# Patient Record
Sex: Female | Born: 1965 | Hispanic: Yes | Marital: Married | Smoking: Never smoker
Health system: Southern US, Community
[De-identification: ages and names within clinical notes are randomized; demographics above are authoritative.]

## PROBLEM LIST (undated history)

## (undated) DIAGNOSIS — I10 Essential (primary) hypertension: Secondary | ICD-10-CM

## (undated) DIAGNOSIS — A150 Tuberculosis of lung: Secondary | ICD-10-CM

## (undated) HISTORY — PX: CHOLECYSTECTOMY, LAPAROSCOPIC: SHX56

## (undated) HISTORY — PX: OTHER SURGICAL HISTORY: SHX169

## (undated) HISTORY — PX: BRAIN BIOPSY: SHX905

## (undated) HISTORY — PX: TUBAL LIGATION: SHX77

## (undated) HISTORY — DX: Tuberculosis of lung: A15.0

---

## 1987-06-02 DIAGNOSIS — A150 Tuberculosis of lung: Secondary | ICD-10-CM

## 1987-06-02 HISTORY — DX: Tuberculosis of lung: A15.0

## 1987-06-02 HISTORY — PX: BRAIN BIOPSY: SHX905

## 2016-01-27 DIAGNOSIS — Z8611 Personal history of tuberculosis: Secondary | ICD-10-CM | POA: Insufficient documentation

## 2017-10-04 ENCOUNTER — Encounter (HOSPITAL_BASED_OUTPATIENT_CLINIC_OR_DEPARTMENT_OTHER): Payer: Self-pay | Admitting: Emergency Medicine

## 2017-10-04 ENCOUNTER — Observation Stay (HOSPITAL_BASED_OUTPATIENT_CLINIC_OR_DEPARTMENT_OTHER)
Admission: EM | Admit: 2017-10-04 | Discharge: 2017-10-05 | Disposition: A | Discharge status: Home / Self Care | Payer: 59 | Attending: Internal Medicine | Admitting: Internal Medicine

## 2017-10-04 ENCOUNTER — Other Ambulatory Visit: Payer: Self-pay

## 2017-10-04 ENCOUNTER — Emergency Department (HOSPITAL_BASED_OUTPATIENT_CLINIC_OR_DEPARTMENT_OTHER): Payer: 59

## 2017-10-04 DIAGNOSIS — E876 Hypokalemia: Secondary | ICD-10-CM | POA: Diagnosis present

## 2017-10-04 DIAGNOSIS — G459 Transient cerebral ischemic attack, unspecified: Secondary | ICD-10-CM | POA: Diagnosis not present

## 2017-10-04 DIAGNOSIS — R079 Chest pain, unspecified: Secondary | ICD-10-CM | POA: Diagnosis not present

## 2017-10-04 DIAGNOSIS — R2990 Unspecified symptoms and signs involving the nervous system: Secondary | ICD-10-CM

## 2017-10-04 DIAGNOSIS — Z3202 Encounter for pregnancy test, result negative: Secondary | ICD-10-CM | POA: Insufficient documentation

## 2017-10-04 DIAGNOSIS — R200 Anesthesia of skin: Secondary | ICD-10-CM

## 2017-10-04 DIAGNOSIS — I10 Essential (primary) hypertension: Secondary | ICD-10-CM | POA: Diagnosis not present

## 2017-10-04 DIAGNOSIS — R531 Weakness: Secondary | ICD-10-CM | POA: Diagnosis present

## 2017-10-04 HISTORY — DX: Essential (primary) hypertension: I10

## 2017-10-04 LAB — CBC
HEMATOCRIT: 43.5 % (ref 36.0–46.0)
HEMOGLOBIN: 14.6 g/dL (ref 12.0–15.0)
MCH: 28.5 pg (ref 26.0–34.0)
MCHC: 33.6 g/dL (ref 30.0–36.0)
MCV: 85 fL (ref 78.0–100.0)
PLATELETS: 354 10*3/uL (ref 150–400)
RBC: 5.12 MIL/uL — AB (ref 3.87–5.11)
RDW: 14.7 % (ref 11.5–15.5)
WBC: 8.3 10*3/uL (ref 4.0–10.5)

## 2017-10-04 LAB — URINALYSIS, MICROSCOPIC (REFLEX)

## 2017-10-04 LAB — DIFFERENTIAL
Basophils Absolute: 0 10*3/uL (ref 0.0–0.1)
Basophils Relative: 1 %
EOS PCT: 2 %
Eosinophils Absolute: 0.1 10*3/uL (ref 0.0–0.7)
LYMPHS PCT: 36 %
Lymphs Abs: 3 10*3/uL (ref 0.7–4.0)
Monocytes Absolute: 0.6 10*3/uL (ref 0.1–1.0)
Monocytes Relative: 7 %
Neutro Abs: 4.6 10*3/uL (ref 1.7–7.7)
Neutrophils Relative %: 54 %

## 2017-10-04 LAB — URINALYSIS, ROUTINE W REFLEX MICROSCOPIC
Bilirubin Urine: NEGATIVE
GLUCOSE, UA: NEGATIVE mg/dL
KETONES UR: NEGATIVE mg/dL
Leukocytes, UA: NEGATIVE
Nitrite: NEGATIVE
PROTEIN: NEGATIVE mg/dL
Specific Gravity, Urine: 1.01 (ref 1.005–1.030)
pH: 7 (ref 5.0–8.0)

## 2017-10-04 LAB — ETHANOL: Alcohol, Ethyl (B): <10 mg/dL (ref <10)

## 2017-10-04 LAB — COMPREHENSIVE METABOLIC PANEL
ALBUMIN: 4.4 g/dL (ref 3.5–5.0)
ALK PHOS: 81 U/L (ref 38–126)
ALT: 19 U/L (ref 14–54)
ANION GAP: 10 (ref 5–15)
AST: 31 U/L (ref 15–41)
BUN: 8 mg/dL (ref 6–20)
CO2: 25 mmol/L (ref 22–32)
Calcium: 9 mg/dL (ref 8.9–10.3)
Chloride: 103 mmol/L (ref 101–111)
Creatinine, Ser: 0.66 mg/dL (ref 0.44–1.00)
GFR calc Af Amer: >60 mL/min (ref >60)
GFR calc non Af Amer: >60 mL/min (ref >60)
GLUCOSE: 94 mg/dL (ref 65–99)
POTASSIUM: 2.9 mmol/L — AB (ref 3.5–5.1)
SODIUM: 138 mmol/L (ref 135–145)
Total Bilirubin: 0.5 mg/dL (ref 0.3–1.2)
Total Protein: 7.8 g/dL (ref 6.5–8.1)

## 2017-10-04 LAB — PREGNANCY, URINE: Preg Test, Ur: NEGATIVE

## 2017-10-04 LAB — TROPONIN I: Troponin I: <0.03 ng/mL (ref <0.03)

## 2017-10-04 LAB — CBG MONITORING, ED: GLUCOSE-CAPILLARY: 88 mg/dL (ref 65–99)

## 2017-10-04 LAB — RAPID URINE DRUG SCREEN, HOSP PERFORMED
AMPHETAMINES: NOT DETECTED
BARBITURATES: NOT DETECTED
BENZODIAZEPINES: NOT DETECTED
COCAINE: NOT DETECTED
Opiates: NOT DETECTED
TETRAHYDROCANNABINOL: NOT DETECTED

## 2017-10-04 LAB — PROTIME-INR
INR: 0.96
PROTHROMBIN TIME: 12.6 s (ref 11.4–15.2)

## 2017-10-04 LAB — APTT: aPTT: 31 seconds (ref 24–36)

## 2017-10-04 MED ORDER — POTASSIUM CHLORIDE CRYS ER 20 MEQ PO TBCR
40.0000 meq | EXTENDED_RELEASE_TABLET | Freq: Once | ORAL | Status: AC
  Administered 2017-10-04: 40 meq via ORAL
  Filled 2017-10-04: qty 2

## 2017-10-04 MED ORDER — POTASSIUM CHLORIDE 10 MEQ/100ML IV SOLN
10.0000 meq | Freq: Once | INTRAVENOUS | Status: AC
  Administered 2017-10-04: 10 meq via INTRAVENOUS
  Filled 2017-10-04: qty 100

## 2017-10-04 MED ORDER — MAGNESIUM SULFATE 2 GM/50ML IV SOLN
2.0000 g | Freq: Once | INTRAVENOUS | Status: AC
  Administered 2017-10-04: 2 g via INTRAVENOUS
  Filled 2017-10-04: qty 50

## 2017-10-04 NOTE — ED Notes (Signed)
Pt. Has no changes in any of her neuro assessments   The Pt. Is a little slower on the L with her movements she has been this way for 30 years she states due to a surgical procedure she had on her brain.

## 2017-10-04 NOTE — ED Triage Notes (Signed)
Pt presents with c/o chest pain that started at 10 am this morning. Pt also reports intermittent numbness in left arm. No arm drift. When patient smiles left side of face slightly drawn. Pt reports left side of face aches and feels tight and left arm feels tight

## 2017-10-04 NOTE — Progress Notes (Signed)
Patient arrived to unit via stretcher with [facility] from [employer]. Patient is alert and oriented. Independent with mobility. Welcomed to [facility] [employer]. Will continue to monitor. Lawson Radar

## 2017-10-04 NOTE — ED Notes (Signed)
Pt. Given orange juice due to no breakfast and feeling like her blood sugar is dropping.

## 2017-10-04 NOTE — ED Provider Notes (Signed)
Emergency Department Provider Note   I have reviewed the triage vital signs and the nursing notes.   HISTORY  Chief Complaint Chest Pain and Extremity Weakness   HPI Holly Daugherty is a 52 y.o. female with a history of hypertension but no other medical problems aside from a distant history of neurologic tuberculosis who presents to the emergency department today secondary to left-sided "weird feeling" and "numbness".  States around 10:00 she had the acute onset of symptoms of left arm weakness and feeling strange.  She said it kind of felt cold and numb but no paresthesias.  She could not quite put her finger what it was.  She started having some mild chest pain around the same time. Also has an abnormal smile that is new. No recent illnesses. No sob. No fevers, cough, diaphoresis or nausea. No other associated or modifying symptoms.    Past Medical History:  Diagnosis Date  . Hypertension     There are no active problems to display for this patient.   History reviewed. No pertinent surgical history.  Current Outpatient Rx  . Order #: [id number] Class: Historical Med  . Order #: [id number] Class: Historical Med    Allergies Patient has no known allergies.  No family history on file.  Social History Social History   Tobacco Use  . Smoking status: Never Smoker  . Smokeless tobacco: Never Used  Substance Use Topics  . Alcohol use: Never    Frequency: Never  . Drug use: Never    Review of Systems  All other systems negative except as documented in the HPI. All pertinent positives and negatives as reviewed in the HPI. ____________________________________________   PHYSICAL EXAM:  VITAL SIGNS: ED Triage Vitals  Enc Vitals Group     BP 10/04/17 1306 (!) 167/85     Pulse Rate 10/04/17 1306 80     Resp 10/04/17 1306 20     Temp 10/04/17 1306 98.7 F (37.1 C)     Temp Source 10/04/17 1306 Oral     SpO2 10/04/17 1306 100 %     Weight 10/04/17 1300 157 lb (71.2 kg)      Height 10/04/17 1300  (1.626 m)    Constitutional: Alert and oriented. Well appearing and in no acute distress. Eyes: Conjunctivae are normal. PERRL. EOMI. Head: Atraumatic. Nose: No congestion/rhinnorhea. Mouth/Throat: Mucous membranes are moist.  Oropharynx non-erythematous. Neck: No stridor.  No meningeal signs.   Cardiovascular: Normal rate, regular rhythm. Good peripheral circulation. Grossly normal heart sounds.   Respiratory: Normal respiratory effort.  No retractions. Lungs CTAB. Gastrointestinal: Soft and nontender. No distention.  Musculoskeletal: No lower extremity tenderness nor edema. No gross deformities of extremities. Neurologic:  Normal speech and language. No gross focal neurologic deficits are appreciated. Abnormal sensation in LUE, left facial droop when smiling.  Skin:  Skin is warm, dry and intact. No rash noted.  ____________________________________________   LABS (all labs ordered are listed, but only abnormal results are displayed)  Labs Reviewed  CBC - Abnormal; Notable for the following components:      Result Value   RBC 5.12 (*)    All other components within normal limits  COMPREHENSIVE METABOLIC PANEL - Abnormal; Notable for the following components:   Potassium 2.9 (*)    All other components within normal limits  URINALYSIS, ROUTINE W REFLEX MICROSCOPIC - Abnormal; Notable for the following components:   Hgb urine dipstick SMALL (*)    All other components within normal limits  URINALYSIS,  MICROSCOPIC (REFLEX) - Abnormal; Notable for the following components:   Bacteria, UA RARE (*)    All other components within normal limits  ETHANOL  PROTIME-INR  APTT  DIFFERENTIAL  TROPONIN I  RAPID URINE DRUG SCREEN, HOSP PERFORMED  PREGNANCY, URINE   ____________________________________________  EKG   EKG Interpretation  Date/Time:  Monday Oct 04 2017 13:00:50 EDT Ventricular Rate:  77 PR Interval:  144 QRS Duration: 82 QT  Interval:  384 QTC Calculation: 434 R Axis:   72 Text Interpretation:  Normal sinus rhythm Normal ECG No old tracing to compare Confirmed by Marily Memos [phone]) on 10/04/2017 1:54:02 PM       ____________________________________________  RADIOLOGY  Dg Chest 2 View  Result Date: 10/04/2017 CLINICAL DATA:  Heavy feeling in chest since 10 a.m. EXAM: CHEST - 2 VIEW COMPARISON:  None. FINDINGS: Normal heart size. Lungs clear. No pneumothorax. No pleural effusion. Mild scoliosis. IMPRESSION: No active cardiopulmonary disease. Electronically Signed   By: Jolaine Click M.D.   On: 10/04/2017 14:23   Ct Head Code Stroke Wo Contrast  Result Date: 10/04/2017 CLINICAL DATA:  Code stroke. Chest pain. LEFT arm and leg numbness and weakness, EXAM: CT HEAD WITHOUT CONTRAST TECHNIQUE: Contiguous axial images were obtained from the base of the skull through the vertex without intravenous contrast. COMPARISON:  None. FINDINGS: Brain: No acute stroke, hemorrhage, mass lesion, hydrocephalus, or extra-axial fluid. Mild atrophy. There is tubular encephalomalacia extending from the RIGHT frontal cortex through the white matter along the frontal horn of the ventricle, resulting in an ovoid area of brain substance loss affecting the RIGHT basal ganglia and external capsule. Previous surgical procedure, RIGHT frontal burr hole, likely some previously implanted device with resultant RIGHT lentiform nucleus chronic infarction. Vascular: No hyperdense vessel or unexpected calcification. Skull: Other than RIGHT frontal burr hole, negative. Sinuses/Orbits: No acute finding. Other: None. ASPECTS [facility] Stroke Program Early CT Score) - Ganglionic level infarction (caudate, lentiform nuclei, internal capsule, insula, M1-M3 cortex): 7 - Supraganglionic infarction (M4-M6 cortex): 3 Total score (0-10 with 10 being normal): 10 IMPRESSION: 1. No acute stroke is evident. Some type of surgical procedure, likely previously implanted device  now removed, has resulted in encephalomalacia and RIGHT basal ganglia infarct. 2. ASPECTS is 10. These results were called by telephone at the time of interpretation on 10/04/2017 at 1:48 pm to Dr. Marily Memos , who verbally acknowledged these results. Electronically Signed   By: Elsie Stain M.D.   On: 10/04/2017 13:49    ____________________________________________   PROCEDURES  Procedure(s) performed:   Procedures  CRITICAL CARE Performed by: Marily Memos Total critical care time: 35 minutes Critical care time was exclusive of separately billable procedures and treating other patients. Critical care was necessary to treat or prevent imminent or life-threatening deterioration. Critical care was time spent personally by me on the following activities: development of treatment plan with patient and/or surrogate as well as nursing, discussions with consultants, evaluation of patient's response to treatment, examination of patient, obtaining history from patient or surrogate, ordering and performing treatments and interventions, ordering and review of laboratory studies, ordering and review of radiographic studies, pulse oximetry and re-evaluation of patient's condition.  ____________________________________________   INITIAL IMPRESSION / ASSESSMENT AND PLAN / ED COURSE  Code stroke called as patient is only had approximately 3 and half hours of symptoms with what was supposedly a new left sided facial droop and then abnormal feeling in her left arm and leg.  CT scan showed that she  had a previous surgical procedure likely sister with a history of neurologic tuberculosis.  Tele-neurology is seen and recommends work-up for stroke versus TIA.  Chest discomfort seems to be associated with the left arm and left leg symptoms so she needs an ACS rule out as well.     Pertinent labs & imaging results that were available during my care of the patient were reviewed by me and considered in my  medical decision making (see chart for details).  ____________________________________________  FINAL CLINICAL IMPRESSION(S) / ED DIAGNOSES  Final diagnoses:  Nonspecific chest pain  Stroke-like symptoms     MEDICATIONS GIVEN DURING THIS VISIT:  Medications - No data to display   NEW OUTPATIENT MEDICATIONS STARTED DURING THIS VISIT:  New Prescriptions   No medications on file    Note:  This note was prepared with assistance of Dragon voice recognition software. Occasional wrong-word or sound-a-like substitutions may have occurred due to the inherent limitations of voice recognition software.   Marily Memos, MD 10/04/17 [phone]

## 2017-10-04 NOTE — Consult Note (Signed)
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  Unclear if the left-sided numbness is recrudescence from this old basal ganglia/subinsular area of damage versus seizure versus a new TIA or stroke.  Given the sudden onset of her symptoms however and the involvement of the left arm and leg would be cautious and treat of the stroke until proven otherwise.  She already took to low-dose aspirin prior to coming into the hospital with certainly give a full 325 dose and admitted for stroke workup/neurology consultation.  Not a tpa candidate due to: Minor nondisabling symptoms  Symptoms (not) consistent with LVO therefore no role for NIR  Differential Diagnosis: As above 1. Cardioembolic stroke 2. Small vessel disease/lacune 3. Thromboembolic, artery-to-artery mechanism 4. Hypercoagulable state-related infarct 5. Transient ischemic attack 6. Thrombotic mechanism, large artery disease  Comments Last known normal 10:00 Door time: 12:51 [facility] contacted: 13:31 [facility] at bedside: 13:39 NIHSS assessment time: 13:40  Recommendations: -Start aspirin -Admit for stroke workup as above -Inpatient neurology consultation  Inpatient neurology consultation Inpatient stroke evaluation as per Neurology/ Internal Medicine Discussed with ED MD Please call with questions ----------------------------------------------------------------------------------------- CC left-sided numbness  History of Present Illness  Patient is a pleasant 52 year old woman with 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  She has no other vascular risk factors but today at 10 AM noticed the sudden onset of left upper extremity numbness primarily below the elbow and in all 5 fingers but also a tight sensation in the left upper extremity.   Also some slight chest pain.  She takes no blood thinners and has no history of stroke.  She never had any recurrence of the CNS tuberculosis.  She denies having any residual weakness numbness or tingling from the brain biopsy.  She has no history of seizure disorder.  Diagnostic: CT head without contrast shows chronic encephalomalacia in the right basal ganglia as well as right frontal region with evidence of a bone defect in the right frontal region.  Nothing acute.  Exam: 1a- LOC: Keenly responsive - =0     1b- LOC questions: Answers both questions correctly - 0     1c- LOC commands- Performs both tasks correctly- 0     2- Gaze: Normal; no gaze paresis or gaze deviation - 0     3- Visual Fields: normal, no Visual field deficit - 0     4- Facial movements: no facial palsy - 0     5- Upper limb motor - no drift -0     6- Lower limb motor - no drift - 0      7- Limb Coordination: absent ataxia - 0      8- Sensory : Decreased sensation to the left arm and leg face is same=1 9- Language - No aphasia - 0      10- Speech - No dysarthria -0     11- Neglect / Extinction - none found -0     NIHSS score  1   Medical Decision Making: - Extensive number of diagnosis or management options are considered above. - Extensive amount of complex data reviewed. - High risk of complication and/or morbidity or mortality are associated with differential diagnostic considerations above. - There may be Uncertain outcome and increased probability of prolonged functional impairment or high probability of severe prolonged functional impairment associated with some of  these differential diagnosis. Medical Data Reviewed: 1.Data reviewed include clinical labs, radiology, Medical Tests; 2.Tests results discussed w/performing or interpreting physician; 3.Obtaining/reviewing old medical records; 4.Obtaining case history from another source; 5.Independent review of image, tracing or specimen. Patient was informed  the Neurology Consult would happen via telehealth (remote video) and consented to receiving care in this manner.

## 2017-10-04 NOTE — ED Notes (Signed)
Informed EDP of pt. Potassium being 2.9

## 2017-10-04 NOTE — Progress Notes (Signed)
Patient presenting to [facility] for chest pain, left-sided weakness, left facial droop.  She was called Code Stroke.  Symptoms started at 10AM.  CT with old encephalomalacia from a biopsy from neurologic TB remotely.  Also has a h/o HTN.  Neurologist recommends MRI and stroke work up.  Will accept to tele Obs.  Georgana Curio, M.D.

## 2017-10-04 NOTE — ED Notes (Signed)
Tele Stroke in room and Dr. Clayborne Dana in room.  Pt. Clearly assessed for Code stroke.  Pt. Reports she last felt normal at 10:00am and symptoms began at 10:00 am.  Pt. York Spaniel that is when she felt her Left arm numb and weak and cold.  She reports her entire jaw felt weak.  Pt. Does have L side droop slightly with smile.  Pt. Has clear speech and clear recollection of entire morning. Pt. Able to feel touch to side of body equally during EDP assessment and does report she took 2 baby asprin at 11am.

## 2017-10-05 ENCOUNTER — Observation Stay (HOSPITAL_BASED_OUTPATIENT_CLINIC_OR_DEPARTMENT_OTHER): Payer: 59

## 2017-10-05 ENCOUNTER — Observation Stay (HOSPITAL_COMMUNITY): Payer: 59

## 2017-10-05 ENCOUNTER — Encounter (HOSPITAL_COMMUNITY): Payer: Self-pay | Admitting: Internal Medicine

## 2017-10-05 DIAGNOSIS — G459 Transient cerebral ischemic attack, unspecified: Secondary | ICD-10-CM

## 2017-10-05 DIAGNOSIS — R079 Chest pain, unspecified: Secondary | ICD-10-CM

## 2017-10-05 DIAGNOSIS — E876 Hypokalemia: Secondary | ICD-10-CM | POA: Diagnosis not present

## 2017-10-05 DIAGNOSIS — R200 Anesthesia of skin: Secondary | ICD-10-CM

## 2017-10-05 DIAGNOSIS — I10 Essential (primary) hypertension: Secondary | ICD-10-CM | POA: Diagnosis present

## 2017-10-05 LAB — COMPREHENSIVE METABOLIC PANEL
ALT: 18 U/L (ref 14–54)
AST: 25 U/L (ref 15–41)
Albumin: 3.7 g/dL (ref 3.5–5.0)
Alkaline Phosphatase: 58 U/L (ref 38–126)
Anion gap: 9 (ref 5–15)
BUN: 6 mg/dL (ref 6–20)
CO2: 23 mmol/L (ref 22–32)
Calcium: 8.9 mg/dL (ref 8.9–10.3)
Chloride: 108 mmol/L (ref 101–111)
Creatinine, Ser: 0.76 mg/dL (ref 0.44–1.00)
GFR calc Af Amer: >60 mL/min (ref >60)
GFR calc non Af Amer: >60 mL/min (ref >60)
Glucose, Bld: 91 mg/dL (ref 65–99)
Potassium: 3.6 mmol/L (ref 3.5–5.1)
Sodium: 140 mmol/L (ref 135–145)
Total Bilirubin: 1 mg/dL (ref 0.3–1.2)
Total Protein: 6.7 g/dL (ref 6.5–8.1)

## 2017-10-05 LAB — TROPONIN I
Troponin I: <0.03 ng/mL (ref <0.03)
Troponin I: <0.03 ng/mL (ref <0.03)
Troponin I: <0.03 ng/mL (ref <0.03)

## 2017-10-05 LAB — CBC
HEMATOCRIT: 41.1 % (ref 36.0–46.0)
HEMOGLOBIN: 13.2 g/dL (ref 12.0–15.0)
MCH: 27.8 pg (ref 26.0–34.0)
MCHC: 32.1 g/dL (ref 30.0–36.0)
MCV: 86.7 fL (ref 78.0–100.0)
Platelets: 305 10*3/uL (ref 150–400)
RBC: 4.74 MIL/uL (ref 3.87–5.11)
RDW: 14.7 % (ref 11.5–15.5)
WBC: 7.2 10*3/uL (ref 4.0–10.5)

## 2017-10-05 LAB — HIV ANTIBODY (ROUTINE TESTING W REFLEX): HIV Screen 4th Generation wRfx: NONREACTIVE

## 2017-10-05 LAB — LIPID PANEL
CHOLESTEROL: 168 mg/dL (ref 0–200)
HDL: 58 mg/dL (ref >40)
LDL Cholesterol: 90 mg/dL (ref 0–99)
TRIGLYCERIDES: 99 mg/dL (ref <150)
Total CHOL/HDL Ratio: 2.9 RATIO
VLDL: 20 mg/dL (ref 0–40)

## 2017-10-05 LAB — MAGNESIUM: Magnesium: 2.5 mg/dL — ABNORMAL HIGH (ref 1.7–2.4)

## 2017-10-05 LAB — ECHOCARDIOGRAM COMPLETE
Height: 64 in
Weight: 2512 oz

## 2017-10-05 LAB — HEMOGLOBIN A1C
HEMOGLOBIN A1C: 5.4 % (ref 4.8–5.6)
MEAN PLASMA GLUCOSE: 108.28 mg/dL

## 2017-10-05 LAB — D-DIMER, QUANTITATIVE: D-Dimer, Quant: <0.27 ug/mL-FEU (ref 0.00–0.50)

## 2017-10-05 MED ORDER — ACETAMINOPHEN 325 MG PO TABS: 650.0000 mg | ORAL_TABLET | ORAL | Status: DC | PRN

## 2017-10-05 MED ORDER — ACETAMINOPHEN 160 MG/5ML PO SOLN: 650.0000 mg | ORAL | Status: DC | PRN

## 2017-10-05 MED ORDER — ASPIRIN 300 MG RE SUPP: 300.0000 mg | Freq: Every day | RECTAL | Status: DC

## 2017-10-05 MED ORDER — IOPAMIDOL (ISOVUE-370) INJECTION 76%
50.0000 mL | Freq: Once | INTRAVENOUS | Status: AC
  Administered 2017-10-05: 50 mL via INTRAVENOUS

## 2017-10-05 MED ORDER — STROKE: EARLY STAGES OF RECOVERY BOOK
Freq: Once | Status: AC
  Administered 2017-10-05: 05:00:00

## 2017-10-05 MED ORDER — ASPIRIN 325 MG PO TABS
325.0000 mg | ORAL_TABLET | Freq: Every day | ORAL | Status: DC
  Administered 2017-10-05: 325 mg via ORAL
  Filled 2017-10-05: qty 1

## 2017-10-05 MED ORDER — ENOXAPARIN SODIUM 40 MG/0.4ML ~~LOC~~ SOLN
40.0000 mg | SUBCUTANEOUS | Status: DC
  Filled 2017-10-05: qty 0.4

## 2017-10-05 MED ORDER — ACETAMINOPHEN 650 MG RE SUPP: 650.0000 mg | RECTAL | Status: DC | PRN

## 2017-10-05 MED ORDER — LISINOPRIL 5 MG PO TABS
5.0000 mg | ORAL_TABLET | Freq: Every day | ORAL | Status: DC
  Administered 2017-10-05: 5 mg via ORAL
  Filled 2017-10-05: qty 1

## 2017-10-05 NOTE — Progress Notes (Signed)
OT Cancellation Note  Patient Details Name: Holly Daugherty MRN: [id number] DOB: 12/12/1965   Cancelled Treatment:    Reason Eval/Treat Not Completed: OT screened, no needs identified, will sign off  [facility], OT/L  [phone] 10/05/2017 10/05/2017, 1:20 PM

## 2017-10-05 NOTE — Progress Notes (Signed)
Pt being discharged from hospital per orders from MD. Pt educated on discharge instructions. Pt verbalized understanding of instructions. All questions and concerns were addressed. Pt's IV was removed prior to discharge. Pt exited hospital via ambulation accompanied by staff. 

## 2017-10-05 NOTE — Evaluation (Signed)
Physical Therapy Evaluation Patient Details Name: Holly Daugherty MRN: [id number] DOB: 05-14-1966 Today's Date: 10/05/2017   History of Present Illness  Caterin Tabares is a 52 y.o. 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 Neuro work up underway.  Clinical Impression  Patient seen for mobility assessment after presenting with neurological changes. Mobilizing well. No significant impairments noted at this time. No further acute PT needs. Will sign off.  Follow Up Recommendations No PT follow up    Equipment Recommendations  None recommended by PT    Recommendations for Other Services       Precautions / Restrictions        Mobility  Bed Mobility Overal bed mobility: Independent                Transfers Overall transfer level: Independent                  Ambulation/Gait Ambulation/Gait assistance: Independent Ambulation Distance (Feet): 350 Feet Assistive device: None Gait Pattern/deviations: WFL(Within Functional Limits)   Gait velocity interpretation: >4.37 ft/sec, indicative of normal walking speed General Gait Details: normal gait, steady with no overt LOB noted  Stairs Stairs: Yes Stairs assistance: Modified independent (Device/Increase time) Stair Management: One rail Right Number of Stairs: 6 General stair comments: no difficulties  Wheelchair Mobility    Modified Rankin (Stroke Patients Only) Modified Rankin (Stroke Patients Only) Pre-Morbid Rankin Score: No symptoms Modified Rankin: No symptoms     Balance Overall balance assessment: Independent   Sitting balance-Leahy Scale: Normal       Standing balance-Leahy Scale: Normal               High level balance activites: Side stepping;Backward walking;Direction changes;Turns;Head turns;Sudden stops High Level Balance  Comments: steady with all higher level tasks             Pertinent Vitals/Pain Pain Assessment: No/denies pain    Home Living Family/patient expects to be discharged to:: Private residence Living Arrangements: Spouse/significant other Available Help at Discharge: Family Type of Home: House Home Access: Stairs to enter Entrance Stairs-Rails: None Entrance Stairs-Number of Steps: 2 Home Layout: Two level;Able to live on main level with bedroom/bathroom Home Equipment: None      Prior Function Level of Independence: Independent               Hand Dominance   Dominant Hand: Right    Extremity/Trunk Assessment   Upper Extremity Assessment Upper Extremity Assessment: Defer to OT evaluation    Lower Extremity Assessment Lower Extremity Assessment: Overall WFL for tasks assessed       Communication   Communication: No difficulties  Cognition Arousal/Alertness: Awake/alert Behavior During Therapy: WFL for tasks assessed/performed Overall Cognitive Status: Within Functional Limits for tasks assessed                                        General Comments      Exercises     Assessment/Plan    PT Assessment Patent does not need any further PT services  PT Problem List         PT Treatment Interventions      PT Goals (Current goals can be found in the Care Plan section)  Acute Rehab  PT Goals PT Goal Formulation: All assessment and education complete, DC therapy    Frequency     Barriers to discharge        Co-evaluation               AM-PAC PT "6 Clicks" Daily Activity  Outcome Measure Difficulty turning over in bed (including adjusting bedclothes, sheets and blankets)?: None Difficulty moving from lying on back to sitting on the side of the bed? : None Difficulty sitting down on and standing up from a chair with arms (e.g., wheelchair, bedside commode, etc,.)?: None Help needed moving to and from a bed to chair (including  a wheelchair)?: None Help needed walking in hospital room?: None Help needed climbing 3-5 steps with a railing? : A Little 6 Click Score: 23    End of Session Equipment Utilized During Treatment: Gait belt Activity Tolerance: Patient tolerated treatment well Patient left: in chair;with call bell/phone within reach Nurse Communication: Mobility status PT Visit Diagnosis: Other symptoms and signs involving the nervous system (R29.898)    Time: 0865-7846 PT Time Calculation (min) (ACUTE ONLY): 15 min   Charges:   PT Evaluation $PT Eval Low Complexity: 1 Low     PT G Codes:        Charlotte Crumb, PT DPT  Board Certified Neurologic Specialist [phone]   Fabio Asa 10/05/2017, 8:32 AM

## 2017-10-05 NOTE — Discharge Summary (Signed)
Physician Discharge Summary  Holly Daugherty WUJ:[id number] DOB: July 29, 1965 DOA: 10/04/2017  PCP: No primary care provider on file.  Admit date: 10/04/2017 Discharge date: 10/05/2017  Admitted From: home Disposition:  home  Recommendations for Outpatient Follow-up:  1. Follow up with PCP in 3-4 weeks. She has an appointment next month for an annual visit.   Home Health: none Equipment/Devices: none  Discharge Condition: stable CODE STATUS: Full code Diet recommendation: regular  HPI: Per Dr. Gilford Silvius, Holly Daugherty is a 52 y.o. 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  Since symptoms continued patient came to the ER at [facility].  Denies any weakness of the upper or lower extremity or any difficulty speaking or swallowing or visual symptoms. ED Course: CT head was showing chronic changes.  On-call tele neurologist was consulted.  Requested further stroke work-up.  While in the ER patient also developed some substernal chest pressure lasted for few minutes and resolved without any intervention.  Hospital Course: TIA - patient was admitted to the hospital with left upper and lower extremity numbness, now resolved. Neurology consulted and followed patient while hospitalized. She underwent an MRI of the brain which was without acute findings except for her chronic encephalomalacia in the right basal ganglia and frontal lobe consistent with patient's history of remote tuberculosis and biopsy. Lipid panel showed an LDL of 90. A1C is 5.4. CT angio head and neck without large vessel occlusion or significant stenosis. She underwent a 2D echo with normal EF and no WMA Chest pain - atypical, resolved. Troponin negative x 2, normal 2D echo, EKG without ischemic changes. D  dimer negative.  Lung nodules - incidental finding, small, recommending outpatient follow up with a repeat non contrast CT chest in 3-6 months. This was discussed with the patient, she already has a follow up appointment with her PCP in June 2019 for annual visit. History of CNS TB - post treatment HTN - continue home medications   Discharge Diagnoses:  Principal Problem:   TIA (transient ischemic attack) Active Problems:   Nonspecific chest pain   Essential hypertension   Hypokalemia   Left sided numbness  Discharge Instructions  Allergies as of 10/05/2017   No Known Allergies     Medication List    TAKE these medications   aspirin 81 MG chewable tablet Chew 162 mg by mouth as needed.   Biotin 5000 MCG Caps Take 1 capsule by mouth daily.   C-500/ROSE HIPS PO Take 1 capsule by mouth daily.   lisinopril 5 MG tablet Commonly known as:  PRINIVIL,ZESTRIL Take 5 mg by mouth daily.      Follow-up Information    PCP. Schedule an appointment as soon as possible for a visit in 5 week(s).   Why:  as scheduled for annual.          Consultations:  Neurology   Procedures/Studies:  2D echo  Study Conclusions - Left ventricle: The cavity size was normal. Wall thickness was normal. Systolic function was normal. The estimated ejection fraction was in the range of 55% to 60%. Wall motion was normal; there were no regional wall motion abnormalities. Left ventricular diastolic function parameters were normal. - Mitral valve: There was mild regurgitation.   EEG Impression: This awake and asleep EEG  is normal.    Ct Angio Head W Or Wo Contrast  Result Date: 10/05/2017 CLINICAL DATA:  Sudden onset left upper extremity numbness. Remote history of CNS tuberculosis and brain biopsy. EXAM: CT ANGIOGRAPHY HEAD AND NECK TECHNIQUE: Multidetector CT imaging of the head and neck was performed using the standard protocol during bolus administration of intravenous contrast. Multiplanar  CT image reconstructions and MIPs were obtained to evaluate the vascular anatomy. Carotid stenosis measurements (when applicable) are obtained utilizing NASCET criteria, using the distal internal carotid diameter as the denominator. CONTRAST:  50mL ISOVUE-370 IOPAMIDOL (ISOVUE-370) INJECTION 76% COMPARISON:  Head CT 10/04/2017 FINDINGS: CTA NECK FINDINGS Aortic arch: Standard 3 vessel aortic arch. Widely patent brachiocephalic and subclavian arteries. Right carotid system: Patent without evidence of stenosis, dissection, or significant atherosclerosis. Left carotid system: Patent without evidence of stenosis, dissection, or significant atherosclerosis. Vertebral arteries: Patent without evidence of stenosis or significant atherosclerosis. Mild irregularity of the left V4 segment including mild segmental dilatation distally. Dominant right vertebral artery. Skeleton: No fracture or suspicious osseous lesion. Other neck: No mass or enlarged lymph nodes. Upper chest: Mild biapical pleural-parenchymal scarring. More nodular density in the posterior left lung apex extending to the pleura measures 10 x 6 mm. Slightly spiculated nodule in the right lung apex measures 6 x 5 mm. Additional smaller nodules are present in both apices. Review of the MIP images confirms the above findings CTA HEAD FINDINGS Anterior circulation: The internal carotid arteries are widely patent from skull base to carotid termini. The ACAs and MCAs are patent without evidence of proximal branch occlusion or significant proximal stenosis. No aneurysm. Posterior circulation: The intracranial vertebral arteries are patent scratched at the intracranial right vertebral artery is widely patent and strongly dominant. The left vertebral artery is particularly hypoplastic distal to the PICA origin. Patent SCA origins are identified bilaterally. The basilar artery is widely patent. Both PCAs are patent without evidence of significant stenosis. There is a  fetal origin of the right PCA. No aneurysm. Venous sinuses: Patent. Anatomic variants: Fetal origin of the right PCA. Delayed phase: No abnormal enhancement. Review of the MIP images confirms the above findings IMPRESSION: 1. No large vessel occlusion or significant stenosis of the intracranial or cervical arterial vasculature. 2. Mild irregularity and slight dilatation of the distal left V4 segment, possibly reflecting an old, healed dissection or fibromuscular dysplasia. 3. Biapical lung nodules measuring up to 8 mm. Non-contrast chest CT at 3-6 months is recommended. If the nodules are stable at time of repeat CT, then future CT at 18-24 months (from today's scan) is considered optional for low-risk patients, but is recommended for high-risk patients. This recommendation follows the consensus statement: Guidelines for Management of Incidental Pulmonary Nodules Detected on CT Images: From the Fleischner Society 2017; Radiology 2017; 284:228-243. Electronically Signed   By: Sebastian Ache M.D.   On: 10/05/2017 12:22   Dg Chest 2 View  Result Date: 10/04/2017 CLINICAL DATA:  Heavy feeling in chest since 10 a.m. EXAM: CHEST - 2 VIEW COMPARISON:  None. FINDINGS: Normal heart size. Lungs clear. No pneumothorax. No pleural effusion. Mild scoliosis. IMPRESSION: No active cardiopulmonary disease. Electronically Signed   By: Jolaine Click M.D.   On: 10/04/2017 14:23   Ct Angio Neck W Or Wo Contrast  Result Date: 10/05/2017 CLINICAL DATA:  Sudden onset left upper extremity numbness. Remote history of CNS tuberculosis and brain biopsy. EXAM: CT ANGIOGRAPHY HEAD AND NECK TECHNIQUE: Multidetector CT imaging of the head and neck was performed  using the standard protocol during bolus administration of intravenous contrast. Multiplanar CT image reconstructions and MIPs were obtained to evaluate the vascular anatomy. Carotid stenosis measurements (when applicable) are obtained utilizing NASCET criteria, using the distal  internal carotid diameter as the denominator. CONTRAST:  50mL ISOVUE-370 IOPAMIDOL (ISOVUE-370) INJECTION 76% COMPARISON:  Head CT 10/04/2017 FINDINGS: CTA NECK FINDINGS Aortic arch: Standard 3 vessel aortic arch. Widely patent brachiocephalic and subclavian arteries. Right carotid system: Patent without evidence of stenosis, dissection, or significant atherosclerosis. Left carotid system: Patent without evidence of stenosis, dissection, or significant atherosclerosis. Vertebral arteries: Patent without evidence of stenosis or significant atherosclerosis. Mild irregularity of the left V4 segment including mild segmental dilatation distally. Dominant right vertebral artery. Skeleton: No fracture or suspicious osseous lesion. Other neck: No mass or enlarged lymph nodes. Upper chest: Mild biapical pleural-parenchymal scarring. More nodular density in the posterior left lung apex extending to the pleura measures 10 x 6 mm. Slightly spiculated nodule in the right lung apex measures 6 x 5 mm. Additional smaller nodules are present in both apices. Review of the MIP images confirms the above findings CTA HEAD FINDINGS Anterior circulation: The internal carotid arteries are widely patent from skull base to carotid termini. The ACAs and MCAs are patent without evidence of proximal branch occlusion or significant proximal stenosis. No aneurysm. Posterior circulation: The intracranial vertebral arteries are patent scratched at the intracranial right vertebral artery is widely patent and strongly dominant. The left vertebral artery is particularly hypoplastic distal to the PICA origin. Patent SCA origins are identified bilaterally. The basilar artery is widely patent. Both PCAs are patent without evidence of significant stenosis. There is a fetal origin of the right PCA. No aneurysm. Venous sinuses: Patent. Anatomic variants: Fetal origin of the right PCA. Delayed phase: No abnormal enhancement. Review of the MIP images confirms  the above findings IMPRESSION: 1. No large vessel occlusion or significant stenosis of the intracranial or cervical arterial vasculature. 2. Mild irregularity and slight dilatation of the distal left V4 segment, possibly reflecting an old, healed dissection or fibromuscular dysplasia. 3. Biapical lung nodules measuring up to 8 mm. Non-contrast chest CT at 3-6 months is recommended. If the nodules are stable at time of repeat CT, then future CT at 18-24 months (from today's scan) is considered optional for low-risk patients, but is recommended for high-risk patients. This recommendation follows the consensus statement: Guidelines for Management of Incidental Pulmonary Nodules Detected on CT Images: From the Fleischner Society 2017; Radiology 2017; 284:228-243. Electronically Signed   By: Sebastian Ache M.D.   On: 10/05/2017 12:22   Mr Brain Wo Contrast  Result Date: 10/05/2017 CLINICAL DATA:  Sudden onset left upper and lower extremity numbness. Remote history of CNS tuberculosis and brain biopsy. EXAM: MRI HEAD WITHOUT CONTRAST TECHNIQUE: Multiplanar, multiecho pulse sequences of the brain and surrounding structures were obtained without intravenous contrast. COMPARISON:  Head CT 10/04/2017 FINDINGS: Brain: No acute infarct, mass, midline shift, or extra-axial fluid collection is seen. There is encephalomalacia involving the right basal ganglia extending into the corona radiata with associated chronic blood products. There is also encephalomalacia more anteriorly and superiorly in the right frontal lobe extending to a burr hole consistent with the patient's history of remote biopsy. There is slight ex vacuo enlargement of the body of the right lateral ventricle. The ventricles are otherwise normal in size. A few scattered punctate foci of T2 hyperintensity in the cerebral white matter bilaterally are nonspecific. Vascular: Major intracranial vascular flow voids are  preserved. Skull and upper cervical spine:  Unremarkable bone marrow signal. Sinuses/Orbits: Unremarkable orbits. Mild mucosal thickening in the paranasal sinuses. Clear mastoid air cells. Other: None. IMPRESSION: 1. No acute intracranial abnormality. 2. Encephalomalacia in the right basal ganglia and frontal lobe consistent with patient's history of remote tuberculosis and biopsy. Electronically Signed   By: Sebastian Ache M.D.   On: 10/05/2017 12:31   Ct Head Code Stroke Wo Contrast  Result Date: 10/04/2017 CLINICAL DATA:  Code stroke. Chest pain. LEFT arm and leg numbness and weakness, EXAM: CT HEAD WITHOUT CONTRAST TECHNIQUE: Contiguous axial images were obtained from the base of the skull through the vertex without intravenous contrast. COMPARISON:  None. FINDINGS: Brain: No acute stroke, hemorrhage, mass lesion, hydrocephalus, or extra-axial fluid. Mild atrophy. There is tubular encephalomalacia extending from the RIGHT frontal cortex through the white matter along the frontal horn of the ventricle, resulting in an ovoid area of brain substance loss affecting the RIGHT basal ganglia and external capsule. Previous surgical procedure, RIGHT frontal burr hole, likely some previously implanted device with resultant RIGHT lentiform nucleus chronic infarction. Vascular: No hyperdense vessel or unexpected calcification. Skull: Other than RIGHT frontal burr hole, negative. Sinuses/Orbits: No acute finding. Other: None. ASPECTS [facility] Stroke Program Early CT Score) - Ganglionic level infarction (caudate, lentiform nuclei, internal capsule, insula, M1-M3 cortex): 7 - Supraganglionic infarction (M4-M6 cortex): 3 Total score (0-10 with 10 being normal): 10 IMPRESSION: 1. No acute stroke is evident. Some type of surgical procedure, likely previously implanted device now removed, has resulted in encephalomalacia and RIGHT basal ganglia infarct. 2. ASPECTS is 10. These results were called by telephone at the time of interpretation on 10/04/2017 at 1:48 pm to Dr.  Marily Memos , who verbally acknowledged these results. Electronically Signed   By: Elsie Stain M.D.   On: 10/04/2017 13:49      Subjective: - no chest pain, shortness of breath, no abdominal pain, nausea or vomiting.   Discharge Exam: Vitals:   10/05/17 0811 10/05/17 1238  BP: 134/85 122/84  Pulse: 87 89  Resp: 16 16  Temp:  98.1 F (36.7 C)  SpO2: 97% 99%    General: Pt is alert, awake, not in acute distress Cardiovascular: RRR, S1/S2 +, no rubs, no gallops Respiratory: CTA bilaterally, no wheezing, no rhonchi Abdominal: Soft, NT, ND, bowel sounds + Extremities: no edema, no cyanosis    The results of significant diagnostics from this hospitalization (including imaging, microbiology, ancillary and laboratory) are listed below for reference.     Microbiology: No results found for this or any previous visit (from the past 240 hour(s)).   Labs: BNP (last 3 results) No results for input(s): BNP in the last 8760 hours. Basic Metabolic Panel: Recent Labs  Lab 10/04/17 1325 10/05/17 0511  NA 138 140  K 2.9* 3.6  CL 103 108  CO2 25 23  GLUCOSE 94 91  BUN 8 6  CREATININE 0.66 0.76  CALCIUM 9.0 8.9  MG  --  2.5*   Liver Function Tests: Recent Labs  Lab 10/04/17 1325 10/05/17 0511  AST 31 25  ALT 19 18  ALKPHOS 81 58  BILITOT 0.5 1.0  PROT 7.8 6.7  ALBUMIN 4.4 3.7   No results for input(s): LIPASE, AMYLASE in the last 168 hours. No results for input(s): AMMONIA in the last 168 hours. CBC: Recent Labs  Lab 10/04/17 1325 10/05/17 0511  WBC 8.3 7.2  NEUTROABS 4.6  --   HGB 14.6 13.2  HCT  43.5 41.1  MCV 85.0 86.7  PLT 354 305   Cardiac Enzymes: Recent Labs  Lab 10/04/17 1320 10/05/17 0511 10/05/17 0933  TROPONINI <0.03 <0.03 <0.03   BNP: Invalid input(s): POCBNP CBG: Recent Labs  Lab 10/04/17 1539  GLUCAP 88   D-Dimer Recent Labs    10/05/17 0728  DDIMER <0.27   Hgb A1c Recent Labs    10/05/17 0511  HGBA1C 5.4   Lipid  Profile Recent Labs    10/05/17 0511  CHOL 168  HDL 58  LDLCALC 90  TRIG 99  CHOLHDL 2.9   Thyroid function studies No results for input(s): TSH, T4TOTAL, T3FREE, THYROIDAB in the last 72 hours.  Invalid input(s): FREET3 Anemia work up No results for input(s): VITAMINB12, FOLATE, FERRITIN, TIBC, IRON, RETICCTPCT in the last 72 hours. Urinalysis    Component Value Date/Time   COLORURINE YELLOW 10/04/2017 1325   APPEARANCEUR CLEAR 10/04/2017 1325   LABSPEC 1.010 10/04/2017 1325   PHURINE 7.0 10/04/2017 1325   GLUCOSEU NEGATIVE 10/04/2017 1325   HGBUR SMALL (A) 10/04/2017 1325   BILIRUBINUR NEGATIVE 10/04/2017 1325   KETONESUR NEGATIVE 10/04/2017 1325   PROTEINUR NEGATIVE 10/04/2017 1325   NITRITE NEGATIVE 10/04/2017 1325   LEUKOCYTESUR NEGATIVE 10/04/2017 1325   Sepsis Labs Invalid input(s): PROCALCITONIN,  WBC,  LACTICIDVEN   Time coordinating discharge: 25 minutes  SIGNED:  Pamella Pert, MD  [facility] 10/05/2017, 4:24 PM Pager [phone]  If 7PM-7AM, please contact night-coverage [email or web address] Password [email or web address]

## 2017-10-05 NOTE — Discharge Instructions (Signed)
Follow with PCP as scheduled next month for your annual visit.   On CT scan we found lung nodules measuring up to 8 mm. Non-contrast chest CT at 3-6 months is recommended. Please discuss this with your PCP.  Please get a complete blood count and chemistry panel checked by your Primary MD at your next visit, and again as instructed by your Primary MD. Please get your medications reviewed and adjusted by your Primary MD.  Please request your Primary MD to go over all Hospital Tests and Procedure/Radiological results at the follow up, please get all Hospital records sent to your Prim MD by signing hospital release before you go home.  If you had Pneumonia of Lung problems at the Hospital: Please get a 2 view Chest X ray done in 6-8 weeks after hospital discharge or sooner if instructed by your Primary MD.  If you have Congestive Heart Failure: Please call your Cardiologist or Primary MD anytime you have any of the following symptoms:  1) 3 pound weight gain in 24 hours or 5 pounds in 1 week  2) shortness of breath, with or without a dry hacking cough  3) swelling in the hands, feet or stomach  4) if you have to sleep on extra pillows at night in order to breathe  Follow cardiac low salt diet and 1.5 lit/day fluid restriction.  If you have diabetes Accuchecks 4 times/day, Once in AM empty stomach and then before each meal. Log in all results and show them to your primary doctor at your next visit. If any glucose reading is under 80 or above 300 call your primary MD immediately.  If you have Seizure/Convulsions/Epilepsy: Please do not drive, operate heavy machinery, participate in activities at heights or participate in high speed sports until you have seen by Primary MD or a Neurologist and advised to do so again.  If you had Gastrointestinal Bleeding: Please ask your Primary MD to check a complete blood count within one week of discharge or at your next visit. Your endoscopic/colonoscopic  biopsies that are pending at the time of discharge, will also need to followed by your Primary MD.  Get Medicines reviewed and adjusted. Please take all your medications with you for your next visit with your Primary MD  Please request your Primary MD to go over all hospital tests and procedure/radiological results at the follow up, please ask your Primary MD to get all Hospital records sent to his/her office.  If you experience worsening of your admission symptoms, develop shortness of breath, life threatening emergency, suicidal or homicidal thoughts you must seek medical attention immediately by calling 911 or calling your MD immediately  if symptoms less severe.  You must read complete instructions/literature along with all the possible adverse reactions/side effects for all the Medicines you take and that have been prescribed to you. Take any new Medicines after you have completely understood and accpet all the possible adverse reactions/side effects.   Do not drive or operate heavy machinery when taking Pain medications.   Do not take more than prescribed Pain, Sleep and Anxiety Medications  Special Instructions: If you have smoked or chewed Tobacco  in the last 2 yrs please stop smoking, stop any regular Alcohol  and or any Recreational drug use.  Wear Seat belts while driving.  Please note You were cared for by a hospitalist during your hospital stay. If you have any questions about your discharge medications or the care you received while you were in the  hospital after you are discharged, you can call the unit and asked to speak with the hospitalist on call if the hospitalist that took care of you is not available. Once you are discharged, your primary care physician will handle any further medical issues. Please note that NO REFILLS for any discharge medications will be authorized once you are discharged, as it is imperative that you return to your primary care physician (or establish a  relationship with a primary care physician if you do not have one) for your aftercare needs so that they can reassess your need for medications and monitor your lab values.  You can reach the hospitalist office at phone [phone] or fax [phone]   If you do not have a primary care physician, you can call [phone] for a physician referral.  Activity: As tolerated with Full fall precautions use walker/cane & assistance as needed  Diet: regular  Disposition Home

## 2017-10-05 NOTE — Procedures (Signed)
ELECTROENCEPHALOGRAM REPORT  Date of Study: 10/05/2017  Patient's Name: [place] Nolton MRN: [id number] Date of Birth: 11/15/1965  Referring Provider: Dr. Midge Minium  Clinical History: This is a 52 year old woman with left-sided numbness  Medications: No AEDs or sedating medications listed  Technical Summary: A multichannel digital EEG recording measured by the international 10-20 system with electrodes applied with paste and impedances below 5000 ohms performed in our laboratory with EKG monitoring in an awake and asleep patient.  Hyperventilation and photic stimulation were performed.  The digital EEG was referentially recorded, reformatted, and digitally filtered in a variety of bipolar and referential montages for optimal display.    Description: The patient is awake and asleep during the recording.  During maximal wakefulness, there is a symmetric, medium voltage 10 Hz posterior dominant rhythm that attenuates with eye opening.  The record is symmetric.  During drowsiness and sleep, there is an increase in theta slowing of the background.  Vertex waves and symmetric sleep spindles were seen.  Hyperventilation and photic stimulation did not elicit any abnormalities.  There were no epileptiform discharges or electrographic seizures seen.    EKG lead was unremarkable.  Impression: This awake and asleep EEG is normal.    Clinical Correlation: A normal EEG does not exclude a clinical diagnosis of epilepsy. Clinical correlation is advised.   Patrcia Dolly, M.D.

## 2017-10-05 NOTE — Progress Notes (Signed)
Went to Pt's room for EEG - pt is at MRI/CT

## 2017-10-05 NOTE — Progress Notes (Signed)
EEG complete - results pending 

## 2017-10-05 NOTE — Progress Notes (Signed)
SLP Cancellation Note  Patient Details Name: Emunah Texidor MRN: [id number] DOB: Oct 21, 1965   Cancelled treatment:       Reason Eval/Treat Not Completed: SLP screened, no needs identified, will sign off   Glenette Bookwalter 10/05/2017, 11:44 AM

## 2017-10-05 NOTE — Consult Note (Signed)
Stroke Neurology Consultation Note  Consult Requested by: Dr. Elvera Lennox  Reason for Consult: left arm numbness  Consult Date: 10/05/17  The history was obtained from the pt.  During history and examination, all items was able to obtain unless otherwise noted.  History of Present Illness:  Holly Daugherty is a 52 y.o. 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  She stated that she was at work and she started to feel left forearm from elbow to left hand numbness and cold feeling, no weakness.  This feeling on and off, lasting more than an hour each episode, and total of about 12 hours.  In the meantime she had a left jaw tense sensation, lasted also about 8 to 10 hours.  She denies any headache, vision changes, speech difficulty, shaking, jerking, LOC or weakness.  She went to urgent care and was sent to ER for evaluation.  Overnight her symptoms resolved.  CT and MRI showed no acute abnormality except right basal ganglia and biopsy tract encephalomalacia.  CTA head and neck showed nondominant left VA V4 stenosis, otherwise vascular unremarkable but bilateral apical lung nodules which may need to follow-up with PCP as outpatient.  CXR negative.  UDS negative, LDL 90, A1c 5.4, HIV negative.  She had CNS TB 30 years ago started with headache during pregnancy, she had induced labor, however recurrent headache, CT showed right brain mass.  Status post biopsy x2, diagnosed with CNS TB and was treated for 2 years.  She had very mild residual left hand dexterity difficulty.  She also stated that she had a intermittent left skin at deltoid region tension sensation, as well as intermittent left cramping in the tensing sensation, but those were chronic.  She also had a history of migraine many years ago since brain biopsy.  Migraine headache was typical migraine episode with headache, had to  go to dark room lay down, put ice in the head.  Due to her previous CNS infection history, frequently she got MRI to rule out recurrent infection.  At that time she had a once or twice a month of migraine headache but last headache was many years ago.  She does have sometimes very mild headache episode since the biopsy, however it was rare.   Past Medical History:  Diagnosis Date  . Hypertension     Past Surgical History:  Procedure Laterality Date  . BRAIN BIOPSY      Family History  Problem Relation Age of Onset  . Diabetes Mellitus II Mother   . CAD Father     Social History:  reports that she has never smoked. She has never used smokeless tobacco. She reports that she drinks alcohol. She reports that she does not use drugs.  Allergies: No Known Allergies  No current facility-administered medications on file prior to encounter.    Current Outpatient Medications on File Prior to Encounter  Medication Sig Dispense Refill  . Ascorbic Acid (C-500/ROSE HIPS PO) Take 1 capsule by mouth daily.    Marland Kitchen aspirin 81 MG chewable tablet Chew 162 mg by mouth as needed.    . Biotin 5000 MCG CAPS Take 1 capsule by mouth daily.    Marland Kitchen lisinopril (PRINIVIL,ZESTRIL) 5 MG tablet Take 5 mg by mouth daily.      Review of Systems: A full ROS was attempted today and was able to be performed.  Systems  assessed include - Constitutional, Eyes,  HENT, Respiratory, Cardiovascular, Gastrointestinal, Genitourinary, Integument/breast, Hematologic/lymphatic, Musculoskeletal, Neurological, Behavioral/Psych, Endocrine, Allergic/Immunologic - with pertinent responses as per HPI.  Physical Examination: Temp:  [97.5 F (36.4 C)-98.1 F (36.7 C)] 98.1 F (36.7 C) (05/07 1238) Pulse Rate:  [68-90] 89 (05/07 1238) Resp:  [14-23] 16 (05/07 1238) BP: (93-148)/(58-93) 122/84 (05/07 1238) SpO2:  [97 %-100 %] 99 % (05/07 1238)  General - well nourished, well developed, in no apparent distress.    Ophthalmologic -  fundi not visualized due to noncooperation.    Cardiovascular - regular rate and rhythm  Mental Status -  Level of arousal and orientation to time, place, and person were intact. Language including expression, naming, repetition, comprehension, reading, and writing was assessed and found intact. Attention span and concentration were normal. Fund of Knowledge was assessed and was intact.  Cranial Nerves II - XII - II - Vision intact OU. III, IV, VI - Extraocular movements intact. V - Facial sensation intact bilaterally. VII - Facial movement intact bilaterally. VIII - Hearing & vestibular intact bilaterally. X - Palate elevates symmetrically. XI - Chin turning & shoulder shrug intact bilaterally. XII - Tongue protrusion intact.  Motor Strength - The patient's strength was normal in all extremities except left hand mild dexterity difficulty and left foot DF 4/5 and pronator drift was absent.   Motor Tone & Bulk - Muscle tone was assessed at the neck and appendages and was normal.  Bulk was normal and fasciculations were absent.   Reflexes - The patient's reflexes were normal in all extremities and she had no pathological reflexes.  Sensory - Light touch, temperature/pinprick were assessed and were normal.    Coordination - The patient had normal movements in the hands and feet with no ataxia or dysmetria.  Tremor was absent.  Gait and Station - deferred  Data Reviewed: I have personally reviewed the radiological images below and agree with the radiology interpretations.  Ct Angio Head W Or Wo Contrast  Result Date: 10/05/2017 CLINICAL DATA:  Sudden onset left upper extremity numbness. Remote history of CNS tuberculosis and brain biopsy. EXAM: CT ANGIOGRAPHY HEAD AND NECK TECHNIQUE: Multidetector CT imaging of the head and neck was performed using the standard protocol during bolus administration of intravenous contrast. Multiplanar CT image reconstructions and MIPs were obtained to  evaluate the vascular anatomy. Carotid stenosis measurements (when applicable) are obtained utilizing NASCET criteria, using the distal internal carotid diameter as the denominator. CONTRAST:  50mL ISOVUE-370 IOPAMIDOL (ISOVUE-370) INJECTION 76% COMPARISON:  Head CT 10/04/2017 FINDINGS: CTA NECK FINDINGS Aortic arch: Standard 3 vessel aortic arch. Widely patent brachiocephalic and subclavian arteries. Right carotid system: Patent without evidence of stenosis, dissection, or significant atherosclerosis. Left carotid system: Patent without evidence of stenosis, dissection, or significant atherosclerosis. Vertebral arteries: Patent without evidence of stenosis or significant atherosclerosis. Mild irregularity of the left V4 segment including mild segmental dilatation distally. Dominant right vertebral artery. Skeleton: No fracture or suspicious osseous lesion. Other neck: No mass or enlarged lymph nodes. Upper chest: Mild biapical pleural-parenchymal scarring. More nodular density in the posterior left lung apex extending to the pleura measures 10 x 6 mm. Slightly spiculated nodule in the right lung apex measures 6 x 5 mm. Additional smaller nodules are present in both apices. Review of the MIP images confirms the above findings CTA HEAD FINDINGS Anterior circulation: The internal carotid arteries are widely patent from skull base to carotid termini. The ACAs and MCAs are patent without evidence of  proximal branch occlusion or significant proximal stenosis. No aneurysm. Posterior circulation: The intracranial vertebral arteries are patent scratched at the intracranial right vertebral artery is widely patent and strongly dominant. The left vertebral artery is particularly hypoplastic distal to the PICA origin. Patent SCA origins are identified bilaterally. The basilar artery is widely patent. Both PCAs are patent without evidence of significant stenosis. There is a fetal origin of the right PCA. No aneurysm. Venous  sinuses: Patent. Anatomic variants: Fetal origin of the right PCA. Delayed phase: No abnormal enhancement. Review of the MIP images confirms the above findings IMPRESSION: 1. No large vessel occlusion or significant stenosis of the intracranial or cervical arterial vasculature. 2. Mild irregularity and slight dilatation of the distal left V4 segment, possibly reflecting an old, healed dissection or fibromuscular dysplasia. 3. Biapical lung nodules measuring up to 8 mm. Non-contrast chest CT at 3-6 months is recommended. If the nodules are stable at time of repeat CT, then future CT at 18-24 months (from today's scan) is considered optional for low-risk patients, but is recommended for high-risk patients. This recommendation follows the consensus statement: Guidelines for Management of Incidental Pulmonary Nodules Detected on CT Images: From the Fleischner Society 2017; Radiology 2017; 284:228-243. Electronically Signed   By: Sebastian Ache M.D.   On: 10/05/2017 12:22   Dg Chest 2 View  Result Date: 10/04/2017 CLINICAL DATA:  Heavy feeling in chest since 10 a.m. EXAM: CHEST - 2 VIEW COMPARISON:  None. FINDINGS: Normal heart size. Lungs clear. No pneumothorax. No pleural effusion. Mild scoliosis. IMPRESSION: No active cardiopulmonary disease. Electronically Signed   By: Jolaine Click M.D.   On: 10/04/2017 14:23   Ct Angio Neck W Or Wo Contrast  Result Date: 10/05/2017 CLINICAL DATA:  Sudden onset left upper extremity numbness. Remote history of CNS tuberculosis and brain biopsy. EXAM: CT ANGIOGRAPHY HEAD AND NECK TECHNIQUE: Multidetector CT imaging of the head and neck was performed using the standard protocol during bolus administration of intravenous contrast. Multiplanar CT image reconstructions and MIPs were obtained to evaluate the vascular anatomy. Carotid stenosis measurements (when applicable) are obtained utilizing NASCET criteria, using the distal internal carotid diameter as the denominator. CONTRAST:   50mL ISOVUE-370 IOPAMIDOL (ISOVUE-370) INJECTION 76% COMPARISON:  Head CT 10/04/2017 FINDINGS: CTA NECK FINDINGS Aortic arch: Standard 3 vessel aortic arch. Widely patent brachiocephalic and subclavian arteries. Right carotid system: Patent without evidence of stenosis, dissection, or significant atherosclerosis. Left carotid system: Patent without evidence of stenosis, dissection, or significant atherosclerosis. Vertebral arteries: Patent without evidence of stenosis or significant atherosclerosis. Mild irregularity of the left V4 segment including mild segmental dilatation distally. Dominant right vertebral artery. Skeleton: No fracture or suspicious osseous lesion. Other neck: No mass or enlarged lymph nodes. Upper chest: Mild biapical pleural-parenchymal scarring. More nodular density in the posterior left lung apex extending to the pleura measures 10 x 6 mm. Slightly spiculated nodule in the right lung apex measures 6 x 5 mm. Additional smaller nodules are present in both apices. Review of the MIP images confirms the above findings CTA HEAD FINDINGS Anterior circulation: The internal carotid arteries are widely patent from skull base to carotid termini. The ACAs and MCAs are patent without evidence of proximal branch occlusion or significant proximal stenosis. No aneurysm. Posterior circulation: The intracranial vertebral arteries are patent scratched at the intracranial right vertebral artery is widely patent and strongly dominant. The left vertebral artery is particularly hypoplastic distal to the PICA origin. Patent SCA origins are identified bilaterally. The basilar  artery is widely patent. Both PCAs are patent without evidence of significant stenosis. There is a fetal origin of the right PCA. No aneurysm. Venous sinuses: Patent. Anatomic variants: Fetal origin of the right PCA. Delayed phase: No abnormal enhancement. Review of the MIP images confirms the above findings IMPRESSION: 1. No large vessel  occlusion or significant stenosis of the intracranial or cervical arterial vasculature. 2. Mild irregularity and slight dilatation of the distal left V4 segment, possibly reflecting an old, healed dissection or fibromuscular dysplasia. 3. Biapical lung nodules measuring up to 8 mm. Non-contrast chest CT at 3-6 months is recommended. If the nodules are stable at time of repeat CT, then future CT at 18-24 months (from today's scan) is considered optional for low-risk patients, but is recommended for high-risk patients. This recommendation follows the consensus statement: Guidelines for Management of Incidental Pulmonary Nodules Detected on CT Images: From the Fleischner Society 2017; Radiology 2017; 284:228-243. Electronically Signed   By: Sebastian Ache M.D.   On: 10/05/2017 12:22   Mr Brain Wo Contrast  Result Date: 10/05/2017 CLINICAL DATA:  Sudden onset left upper and lower extremity numbness. Remote history of CNS tuberculosis and brain biopsy. EXAM: MRI HEAD WITHOUT CONTRAST TECHNIQUE: Multiplanar, multiecho pulse sequences of the brain and surrounding structures were obtained without intravenous contrast. COMPARISON:  Head CT 10/04/2017 FINDINGS: Brain: No acute infarct, mass, midline shift, or extra-axial fluid collection is seen. There is encephalomalacia involving the right basal ganglia extending into the corona radiata with associated chronic blood products. There is also encephalomalacia more anteriorly and superiorly in the right frontal lobe extending to a burr hole consistent with the patient's history of remote biopsy. There is slight ex vacuo enlargement of the body of the right lateral ventricle. The ventricles are otherwise normal in size. A few scattered punctate foci of T2 hyperintensity in the cerebral white matter bilaterally are nonspecific. Vascular: Major intracranial vascular flow voids are preserved. Skull and upper cervical spine: Unremarkable bone marrow signal. Sinuses/Orbits:  Unremarkable orbits. Mild mucosal thickening in the paranasal sinuses. Clear mastoid air cells. Other: None. IMPRESSION: 1. No acute intracranial abnormality. 2. Encephalomalacia in the right basal ganglia and frontal lobe consistent with patient's history of remote tuberculosis and biopsy. Electronically Signed   By: Sebastian Ache M.D.   On: 10/05/2017 12:31   Ct Head Code Stroke Wo Contrast  Result Date: 10/04/2017 CLINICAL DATA:  Code stroke. Chest pain. LEFT arm and leg numbness and weakness, EXAM: CT HEAD WITHOUT CONTRAST TECHNIQUE: Contiguous axial images were obtained from the base of the skull through the vertex without intravenous contrast. COMPARISON:  None. FINDINGS: Brain: No acute stroke, hemorrhage, mass lesion, hydrocephalus, or extra-axial fluid. Mild atrophy. There is tubular encephalomalacia extending from the RIGHT frontal cortex through the white matter along the frontal horn of the ventricle, resulting in an ovoid area of brain substance loss affecting the RIGHT basal ganglia and external capsule. Previous surgical procedure, RIGHT frontal burr hole, likely some previously implanted device with resultant RIGHT lentiform nucleus chronic infarction. Vascular: No hyperdense vessel or unexpected calcification. Skull: Other than RIGHT frontal burr hole, negative. Sinuses/Orbits: No acute finding. Other: None. ASPECTS [facility] Stroke Program Early CT Score) - Ganglionic level infarction (caudate, lentiform nuclei, internal capsule, insula, M1-M3 cortex): 7 - Supraganglionic infarction (M4-M6 cortex): 3 Total score (0-10 with 10 being normal): 10 IMPRESSION: 1. No acute stroke is evident. Some type of surgical procedure, likely previously implanted device now removed, has resulted in encephalomalacia and RIGHT basal  ganglia infarct. 2. ASPECTS is 10. These results were called by telephone at the time of interpretation on 10/04/2017 at 1:48 pm to Dr. Marily Memos , who verbally acknowledged these  results. Electronically Signed   By: Elsie Stain M.D.   On: 10/04/2017 13:49   EEG - normal  TTE - Left ventricle: The cavity size was normal. Wall thickness was   normal. Systolic function was normal. The estimated ejection   fraction was in the range of 55% to 60%. Wall motion was normal;   there were no regional wall motion abnormalities. Left   ventricular diastolic function parameters were normal. - Mitral valve: There was mild regurgitation. Impressions: - Normal LV systolic and diastolic function; mild MR and TR.   Assessment: 52 y.o. 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 No headache, vision changes, speech difficulty, or weakness.  Overnight her symptoms resolved.  CT and MRI showed no acute abnormality except right basal ganglia and biopsy tract encephalomalacia.  CTA head and neck showed nondominant left VA V4 stenosis, otherwise vascular unremarkable but bilateral apical lung nodules which may need to follow-up with PCP as outpatient.  CXR negative.  UDS negative, LDL 90, A1c 5.4, HIV negative. Although hx of CNS TB 30 years ago, currently no sign of CNS infection. EEG normal, no sharking jerking or LOC to suggest seizure at this time. She does have history of typical migraine many years ago since brain biopsy, and I am more leaning towards her episode likely complicated migraine without headache.   Plan: - no need ASA from neuro standpoint - LDL at goal, no need statin at this time - Pt needs to follow up with PCP for migraine management - pt needs to follow up with PCP for lung nodule follow up - if recurrent, may repeat EEG and consider AED if needed.  - Neurology will sign off. No neuro follow up needed at this time. Thank you for this consultation and allowing [place] to participate in the care of this patient.  Marvel Plan, MD PhD Stroke  Neurology 10/05/2017 3:52 PM

## 2017-10-05 NOTE — Progress Notes (Signed)
  Echocardiogram 2D Echocardiogram has been performed.  Holly Daugherty 10/05/2017, 1:54 PM

## 2017-10-05 NOTE — H&P (Signed)
History and Physical    Holly Daugherty ZOX:[id number] DOB: [birth year] DOA: 10/04/2017  PCP: No primary care provider on file.  Patient coming from: Home.  Chief Complaint: Left upper and lower extremity numbness.  HPI: Holly Daugherty is a 52 y.o. 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  Since symptoms continued patient came to the ER at [facility].  Denies any weakness of the upper or lower extremity or any difficulty speaking or swallowing or visual symptoms.  ED Course: CT head was showing chronic changes.  On-call tele neurologist was consulted.  Requested further stroke work-up.  While in the ER patient also developed some substernal chest pressure lasted for few minutes and resolved without any intervention.  Review of Systems: As per HPI, rest all negative.   Past Medical History:  Diagnosis Date  . Hypertension     Past Surgical History:  Procedure Laterality Date  . BRAIN BIOPSY       reports that she has never smoked. She has never used smokeless tobacco. She reports that she drinks alcohol. She reports that she does not use drugs.  No Known Allergies  Family History  Problem Relation Age of Onset  . Diabetes Mellitus II Mother   . CAD Father     Prior to Admission medications   Medication Sig Start Date End Date Taking? Authorizing Provider  Ascorbic Acid (C-500/ROSE HIPS PO) Take 1 capsule by mouth daily.   Yes [provider]  aspirin 81 MG chewable tablet Chew 162 mg by mouth as needed.   Yes [provider]  Biotin 5000 MCG CAPS Take 1 capsule by mouth daily.   Yes [provider]  lisinopril (PRINIVIL,ZESTRIL) 5 MG tablet Take 5 mg by mouth daily.   Yes [provider]    Physical Exam: Vitals:    10/04/17 1759 10/04/17 2002 10/05/17 0026 10/05/17 0028  BP: (!) 148/93 121/79 (!) 93/58 111/72  Pulse: 90 68 78 74  Resp: Temp:  97.9 F (36.6 C) 97.8 F (36.6 C)   TempSrc:  Oral Oral   SpO2: 100% 99% 98% 97%  Weight:      Height:          Constitutional: Moderately built and nourished. Vitals:   10/04/17 1759 10/04/17 2002 10/05/17 0026 10/05/17 0028  BP: (!) 148/93 121/79 (!) 93/58 111/72  Pulse: 90 68 78 74  Resp: Temp:  97.9 F (36.6 C) 97.8 F (36.6 C)   TempSrc:  Oral Oral   SpO2: 100% 99% 98% 97%  Weight:      Height:       Eyes: Anicteric no pallor. ENMT: No discharge from the ears eyes nose or mouth. Neck: No mass felt.  No neck rigidity.  No JVD appreciated. Respiratory: No rhonchi or crepitations. Cardiovascular: S1-S2 heard no murmurs appreciated. Abdomen: Soft nontender bowel sounds present. Musculoskeletal: No edema.  No joint effusion. Skin: No rash.  Skin appears warm. Neurologic: Alert awake oriented to time place and person.  Moves all extremities 5 x 5.  No facial asymmetry is midline pupils are equal and reacting to light. Psychiatric: Appears normal.  Normal affect.   Labs on Admission: I have personally reviewed following  labs and imaging studies  CBC: Recent Labs  Lab 10/04/17 1325  WBC 8.3  NEUTROABS 4.6  HGB 14.6  HCT 43.5  MCV 85.0  PLT 354   Basic Metabolic Panel: Recent Labs  Lab 10/04/17 1325  NA 138  K 2.9*  CL 103  CO2 25  GLUCOSE 94  BUN 8  CREATININE 0.66  CALCIUM 9.0   GFR: Estimated Creatinine Clearance: 80.5 mL/min (by C-G formula based on SCr of 0.66 mg/dL). Liver Function Tests: Recent Labs  Lab 10/04/17 1325  AST 31  ALT 19  ALKPHOS 81  BILITOT 0.5  PROT 7.8  ALBUMIN 4.4   No results for input(s): LIPASE, AMYLASE in the last 168 hours. No results for input(s): AMMONIA in the last 168 hours. Coagulation Profile: Recent Labs  Lab 10/04/17 1325  INR 0.96   Cardiac  Enzymes: Recent Labs  Lab 10/04/17 1320  TROPONINI <0.03   BNP (last 3 results) No results for input(s): PROBNP in the last 8760 hours. HbA1C: No results for input(s): HGBA1C in the last 72 hours. CBG: Recent Labs  Lab 10/04/17 1539  GLUCAP 88   Lipid Profile: No results for input(s): CHOL, HDL, LDLCALC, TRIG, CHOLHDL, LDLDIRECT in the last 72 hours. Thyroid Function Tests: No results for input(s): TSH, T4TOTAL, FREET4, T3FREE, THYROIDAB in the last 72 hours. Anemia Panel: No results for input(s): VITAMINB12, FOLATE, FERRITIN, TIBC, IRON, RETICCTPCT in the last 72 hours. Urine analysis:    Component Value Date/Time   COLORURINE YELLOW 10/04/2017 1325   APPEARANCEUR CLEAR 10/04/2017 1325   LABSPEC 1.010 10/04/2017 1325   PHURINE 7.0 10/04/2017 1325   GLUCOSEU NEGATIVE 10/04/2017 1325   HGBUR SMALL (A) 10/04/2017 1325   BILIRUBINUR NEGATIVE 10/04/2017 1325   KETONESUR NEGATIVE 10/04/2017 1325   PROTEINUR NEGATIVE 10/04/2017 1325   NITRITE NEGATIVE 10/04/2017 1325   LEUKOCYTESUR NEGATIVE 10/04/2017 1325   Sepsis Labs: (procalcitonin:4,lacticidven:4) )No results found for this or any previous visit (from the past 240 hour(s)).   Radiological Exams on Admission: Dg Chest 2 View  Result Date: 10/04/2017 CLINICAL DATA:  Heavy feeling in chest since 10 a.m. EXAM: CHEST - 2 VIEW COMPARISON:  None. FINDINGS: Normal heart size. Lungs clear. No pneumothorax. No pleural effusion. Mild scoliosis. IMPRESSION: No active cardiopulmonary disease. Electronically Signed   By: Jolaine Click M.D.   On: 10/04/2017 14:23   Ct Head Code Stroke Wo Contrast  Result Date: 10/04/2017 CLINICAL DATA:  Code stroke. Chest pain. LEFT arm and leg numbness and weakness, EXAM: CT HEAD WITHOUT CONTRAST TECHNIQUE: Contiguous axial images were obtained from the base of the skull through the vertex without intravenous contrast. COMPARISON:  None. FINDINGS: Brain: No acute stroke, hemorrhage, mass  lesion, hydrocephalus, or extra-axial fluid. Mild atrophy. There is tubular encephalomalacia extending from the RIGHT frontal cortex through the white matter along the frontal horn of the ventricle, resulting in an ovoid area of brain substance loss affecting the RIGHT basal ganglia and external capsule. Previous surgical procedure, RIGHT frontal burr hole, likely some previously implanted device with resultant RIGHT lentiform nucleus chronic infarction. Vascular: No hyperdense vessel or unexpected calcification. Skull: Other than RIGHT frontal burr hole, negative. Sinuses/Orbits: No acute finding. Other: None. ASPECTS [facility] Stroke Program Early CT Score) - Ganglionic level infarction (caudate, lentiform nuclei, internal capsule, insula, M1-M3 cortex): 7 - Supraganglionic infarction (M4-M6 cortex): 3 Total score (0-10 with 10 being normal): 10 IMPRESSION: 1. No acute stroke is evident. Some type of surgical procedure, likely previously implanted device  now removed, has resulted in encephalomalacia and RIGHT basal ganglia infarct. 2. ASPECTS is 10. These results were called by telephone at the time of interpretation on 10/04/2017 at 1:48 pm to Dr. Marily Memos , who verbally acknowledged these results. Electronically Signed   By: Elsie Stain M.D.   On: 10/04/2017 13:49    EKG: Independently reviewed.  Normal sinus rhythm with T wave inversion in V1.  Assessment/Plan Principal Problem:   TIA (transient ischemic attack) Active Problems:   Nonspecific chest pain   Essential hypertension   Hypokalemia    1. TIA versus stroke -appreciate neurology consult.  Patient has passed swallow.  Will keep patient on aspirin.  MRI brain/MRA brain 2D echo carotid Doppler has been ordered.  EEG.  Physical therapy consult. 2. Chest pain -occurred while patient was in the ER.  Presently chest pain-free.  We will cycle cardiac markers check d-dimer 2D echo patient is on aspirin. 3. Hypertension on ACE inhibitor.  Allow  for permissive hypertension until stroke ruled out. 4. Hypokalemia -cause not clear.  Replace and recheck check magnesium levels. 5. History of CNS TB.   DVT prophylaxis: Lovenox. Code Status: Full code. Family Communication: Discussed with patient. Disposition Plan: Home. Consults called: Neurology. Admission status: Observation.   Eduard Clos MD [facility] Pager [phone].  If 7PM-7AM, please contact night-coverage [email or web address] Password [email or web address]  10/05/2017, 4:15 AM

## 2020-03-11 IMAGING — CT CT ANGIO HEAD
1 of 11 series · 5 of 33 positions shown · IV contrast (OMNI 350)
Comparison: Head CT 10/04/2017

CLINICAL DATA: Sudden onset left upper extremity numbness. Remote
history of CNS tuberculosis and brain biopsy.

EXAM:
CT ANGIOGRAPHY HEAD AND NECK
TECHNIQUE: Multidetector CT imaging of the head and neck was performed using
the standard protocol during bolus administration of intravenous
contrast. Multiplanar CT image reconstructions and MIPs were
obtained to evaluate the vascular anatomy. Carotid stenosis
measurements (when applicable) are obtained utilizing NASCET
criteria, using the distal internal carotid diameter as the
denominator.
CONTRAST:  50mL GNE9TS-9VV IOPAMIDOL (GNE9TS-9VV) INJECTION 76%

[Series 6: cta neck thins · axial · 0.38mm/px · z∈[-251,-28]mm · 5 of 838 slices shown]
[im 140/838  soft-tissue]
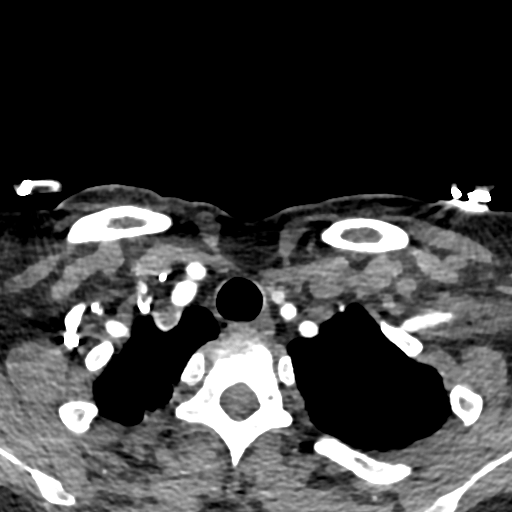
[im 280/838  bone]
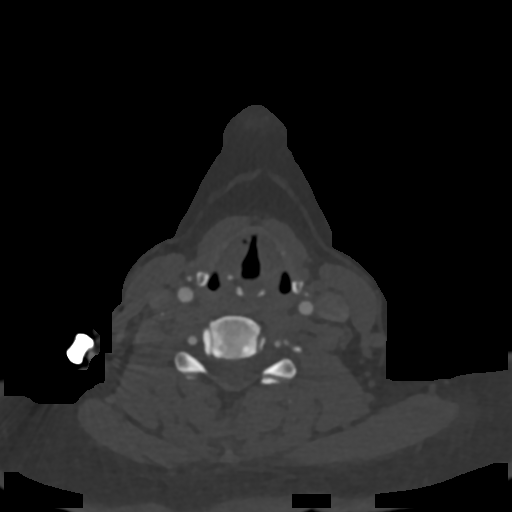
[im 419/838  soft-tissue]
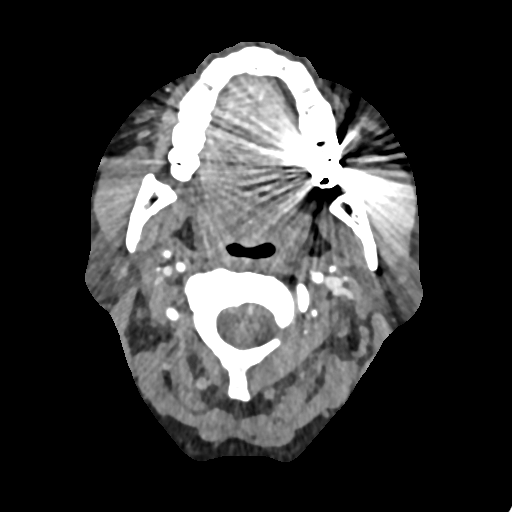
[im 559/838  bone]
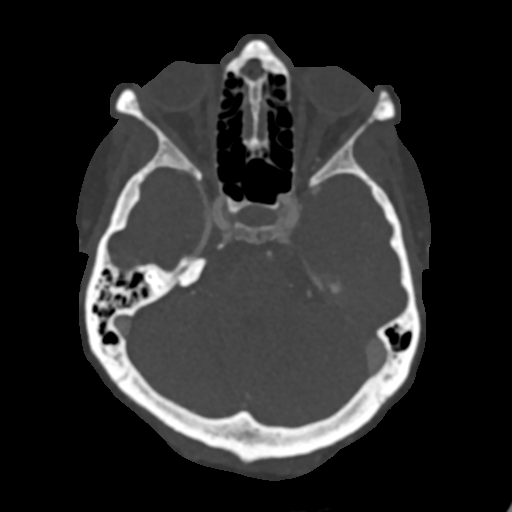
[im 698/838  soft-tissue]
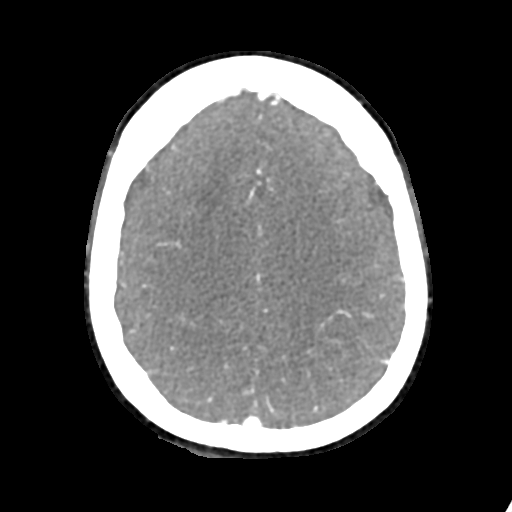

[5 of 33 positions shown; findings below may reference images not displayed]

FINDINGS: CTA NECK FINDINGS

Aortic arch: Standard 3 vessel aortic arch. Widely patent
brachiocephalic and subclavian arteries.

Right carotid system: Patent without evidence of stenosis,
dissection, or significant atherosclerosis.

Left carotid system: Patent without evidence of stenosis,
dissection, or significant atherosclerosis.

Vertebral arteries: Patent without evidence of stenosis or
significant atherosclerosis. Mild irregularity of the left V4
segment including mild segmental dilatation distally. Dominant right
vertebral artery.

Skeleton: No fracture or suspicious osseous lesion.

Other neck: No mass or enlarged lymph nodes.

Upper chest: Mild biapical pleural-parenchymal scarring. More
nodular density in the posterior left lung apex extending to the
pleura measures 10 x 6 mm. Slightly spiculated nodule in the right
lung apex measures 6 x 5 mm. Additional smaller nodules are present
in both apices.

Review of the MIP images confirms the above findings

CTA HEAD FINDINGS

Anterior circulation: The internal carotid arteries are widely
patent from skull base to carotid termini. The ACAs and MCAs are
patent without evidence of proximal branch occlusion or significant
proximal stenosis. No aneurysm.

Posterior circulation: The intracranial vertebral arteries are
patent scratched at the intracranial right vertebral artery is
widely patent and strongly dominant. The left vertebral artery is
particularly hypoplastic distal to the PICA origin. Patent SCA
origins are identified bilaterally. The basilar artery is widely
patent. Both PCAs are patent without evidence of significant
stenosis. There is a fetal origin of the right PCA. No aneurysm.

Venous sinuses: Patent.

Anatomic variants: Fetal origin of the right PCA.

Delayed phase: No abnormal enhancement.

Review of the MIP images confirms the above findings
IMPRESSION: 1. No large vessel occlusion or significant stenosis of the
intracranial or cervical arterial vasculature.
2. Mild irregularity and slight dilatation of the distal left V4
segment, possibly reflecting an old, healed dissection or
fibromuscular dysplasia.
3. Biapical lung nodules measuring up to 8 mm. Non-contrast chest CT
at 3-6 months is recommended. If the nodules are stable at time of
repeat CT, then future CT at 18-24 months (from today's scan) is
considered optional for low-risk patients, but is recommended for
high-risk patients. This recommendation follows the consensus
statement: Guidelines for Management of Incidental Pulmonary Nodules
Detected on CT Images: From the [HOSPITAL] 8761; Radiology

## 2021-08-01 DIAGNOSIS — H109 Unspecified conjunctivitis: Secondary | ICD-10-CM | POA: Diagnosis not present

## 2021-08-01 DIAGNOSIS — B9689 Other specified bacterial agents as the cause of diseases classified elsewhere: Secondary | ICD-10-CM | POA: Diagnosis not present

## 2021-08-12 DIAGNOSIS — I10 Essential (primary) hypertension: Secondary | ICD-10-CM | POA: Diagnosis not present

## 2021-08-12 DIAGNOSIS — B351 Tinea unguium: Secondary | ICD-10-CM | POA: Diagnosis not present

## 2022-02-18 DIAGNOSIS — I10 Essential (primary) hypertension: Secondary | ICD-10-CM | POA: Diagnosis not present

## 2022-02-18 DIAGNOSIS — Z130 Encounter for screening for diseases of the blood and blood-forming organs and certain disorders involving the immune mechanism: Secondary | ICD-10-CM | POA: Diagnosis not present

## 2022-02-18 DIAGNOSIS — Z13220 Encounter for screening for lipoid disorders: Secondary | ICD-10-CM | POA: Diagnosis not present

## 2022-02-18 DIAGNOSIS — Z0000 Encounter for general adult medical examination without abnormal findings: Secondary | ICD-10-CM | POA: Diagnosis not present

## 2022-02-18 DIAGNOSIS — Z1329 Encounter for screening for other suspected endocrine disorder: Secondary | ICD-10-CM | POA: Diagnosis not present

## 2022-02-18 DIAGNOSIS — Z13228 Encounter for screening for other metabolic disorders: Secondary | ICD-10-CM | POA: Diagnosis not present

## 2022-02-19 DIAGNOSIS — Z1231 Encounter for screening mammogram for malignant neoplasm of breast: Secondary | ICD-10-CM | POA: Diagnosis not present

## 2022-11-11 DIAGNOSIS — L821 Other seborrheic keratosis: Secondary | ICD-10-CM | POA: Diagnosis not present

## 2022-11-11 DIAGNOSIS — L814 Other melanin hyperpigmentation: Secondary | ICD-10-CM | POA: Diagnosis not present

## 2022-11-11 DIAGNOSIS — X32XXXS Exposure to sunlight, sequela: Secondary | ICD-10-CM | POA: Diagnosis not present

## 2022-11-11 DIAGNOSIS — D229 Melanocytic nevi, unspecified: Secondary | ICD-10-CM | POA: Diagnosis not present

## 2022-11-11 DIAGNOSIS — L570 Actinic keratosis: Secondary | ICD-10-CM | POA: Diagnosis not present

## 2023-03-12 LAB — HM PAP SMEAR: HM Pap smear: NEGATIVE

## 2023-03-12 LAB — HM MAMMOGRAPHY

## 2023-03-16 DIAGNOSIS — Z1231 Encounter for screening mammogram for malignant neoplasm of breast: Secondary | ICD-10-CM | POA: Diagnosis not present

## 2023-03-16 DIAGNOSIS — Z01419 Encounter for gynecological examination (general) (routine) without abnormal findings: Secondary | ICD-10-CM | POA: Diagnosis not present

## 2023-03-16 DIAGNOSIS — Z1331 Encounter for screening for depression: Secondary | ICD-10-CM | POA: Diagnosis not present

## 2023-03-16 DIAGNOSIS — Z124 Encounter for screening for malignant neoplasm of cervix: Secondary | ICD-10-CM | POA: Diagnosis not present

## 2023-05-20 ENCOUNTER — Encounter: Payer: Self-pay | Admitting: Emergency Medicine

## 2023-05-20 ENCOUNTER — Ambulatory Visit: Payer: BC Managed Care – PPO | Admitting: Emergency Medicine

## 2023-05-20 VITALS — BP 150/92 | HR 102 | Temp 97.9°F | Ht 64.0 in | Wt 159.0 lb

## 2023-05-20 DIAGNOSIS — Z7689 Persons encountering health services in other specified circumstances: Secondary | ICD-10-CM

## 2023-05-20 DIAGNOSIS — I10 Essential (primary) hypertension: Secondary | ICD-10-CM

## 2023-05-20 LAB — CBC WITH DIFFERENTIAL/PLATELET
Basophils Absolute: 0 10*3/uL (ref 0.0–0.1)
Basophils Relative: 0.5 % (ref 0.0–3.0)
Eosinophils Absolute: 0.2 10*3/uL (ref 0.0–0.7)
Eosinophils Relative: 2.3 % (ref 0.0–5.0)
HCT: 43.7 % (ref 36.0–46.0)
Hemoglobin: 14.8 g/dL (ref 12.0–15.0)
Lymphocytes Relative: 40 % (ref 12.0–46.0)
Lymphs Abs: 3 10*3/uL (ref 0.7–4.0)
MCHC: 33.8 g/dL (ref 30.0–36.0)
MCV: 90.2 fL (ref 78.0–100.0)
Monocytes Absolute: 0.4 10*3/uL (ref 0.1–1.0)
Monocytes Relative: 5.2 % (ref 3.0–12.0)
Neutro Abs: 3.9 10*3/uL (ref 1.4–7.7)
Neutrophils Relative %: 52 % (ref 43.0–77.0)
Platelets: 298 10*3/uL (ref 150.0–400.0)
RBC: 4.85 Mil/uL (ref 3.87–5.11)
RDW: 13.2 % (ref 11.5–15.5)
WBC: 7.4 10*3/uL (ref 4.0–10.5)

## 2023-05-20 LAB — COMPREHENSIVE METABOLIC PANEL
ALT: 21 U/L (ref 0–35)
AST: 26 U/L (ref 0–37)
Albumin: 4.4 g/dL (ref 3.5–5.2)
Alkaline Phosphatase: 77 U/L (ref 39–117)
BUN: 12 mg/dL (ref 6–23)
CO2: 27 meq/L (ref 19–32)
Calcium: 9.7 mg/dL (ref 8.4–10.5)
Chloride: 104 meq/L (ref 96–112)
Creatinine, Ser: 0.76 mg/dL (ref 0.40–1.20)
GFR: 87.36 mL/min (ref >60.00)
Glucose, Bld: 122 mg/dL — ABNORMAL HIGH (ref 70–99)
Potassium: 3.4 meq/L — ABNORMAL LOW (ref 3.5–5.1)
Sodium: 140 meq/L (ref 135–145)
Total Bilirubin: 0.5 mg/dL (ref 0.2–1.2)
Total Protein: 7.3 g/dL (ref 6.0–8.3)

## 2023-05-20 LAB — LIPID PANEL
Cholesterol: 176 mg/dL (ref 0–200)
HDL: 54.3 mg/dL (ref >39.00)
LDL Cholesterol: 86 mg/dL (ref 0–99)
NonHDL: 121.37
Total CHOL/HDL Ratio: 3
Triglycerides: 179 mg/dL — ABNORMAL HIGH (ref 0.0–149.0)
VLDL: 35.8 mg/dL (ref 0.0–40.0)

## 2023-05-20 LAB — HEMOGLOBIN A1C: Hgb A1c MFr Bld: 6.1 % (ref 4.6–6.5)

## 2023-05-20 MED ORDER — LISINOPRIL 10 MG PO TABS: 10.0000 mg | ORAL_TABLET | Freq: Every day | ORAL | 3 refills | Status: DC

## 2023-05-20 NOTE — Assessment & Plan Note (Signed)
Elevated blood pressure readings in the office Cardiovascular risks associated with uncontrolled hypertension discussed Dietary approaches to stop hypertension discussed Recommend to start lisinopril 10 mg daily Advised to monitor blood pressure readings at home daily for the next several weeks and keep a log.  Advised to contact the office if numbers persistently abnormal. Blood work today. Follow-up in 3 months.

## 2023-05-20 NOTE — Patient Instructions (Signed)
Hypertension, Adult High blood pressure (hypertension) is when the force of blood pumping through the arteries is too strong. The arteries are the blood vessels that carry blood from the heart throughout the body. Hypertension forces the heart to work harder to pump blood and may cause arteries to become narrow or stiff. Untreated or uncontrolled hypertension can lead to a heart attack, heart failure, a stroke, kidney disease, and other problems. A blood pressure reading consists of a higher number over a lower number. Ideally, your blood pressure should be below 120/80. The first ("top") number is called the systolic pressure. It is a measure of the pressure in your arteries as your heart beats. The second ("bottom") number is called the diastolic pressure. It is a measure of the pressure in your arteries as the heart relaxes. What are the causes? The exact cause of this condition is not known. There are some conditions that result in high blood pressure. What increases the risk? Certain factors may make you more likely to develop high blood pressure. Some of these risk factors are under your control, including: Smoking. Not getting enough exercise or physical activity. Being overweight. Having too much fat, sugar, calories, or salt (sodium) in your diet. Drinking too much alcohol. Other risk factors include: Having a personal history of heart disease, diabetes, high cholesterol, or kidney disease. Stress. Having a family history of high blood pressure and high cholesterol. Having obstructive sleep apnea. Age. The risk increases with age. What are the signs or symptoms? High blood pressure may not cause symptoms. Very high blood pressure (hypertensive crisis) may cause: Headache. Fast or irregular heartbeats (palpitations). Shortness of breath. Nosebleed. Nausea and vomiting. Vision changes. Severe chest pain, dizziness, and seizures. How is this diagnosed? This condition is diagnosed by  measuring your blood pressure while you are seated, with your arm resting on a flat surface, your legs uncrossed, and your feet flat on the floor. The cuff of the blood pressure monitor will be placed directly against the skin of your upper arm at the level of your heart. Blood pressure should be measured at least twice using the same arm. Certain conditions can cause a difference in blood pressure between your right and left arms. If you have a high blood pressure reading during one visit or you have normal blood pressure with other risk factors, you may be asked to: Return on a different day to have your blood pressure checked again. Monitor your blood pressure at home for 1 week or longer. If you are diagnosed with hypertension, you may have other blood or imaging tests to help your health care provider understand your overall risk for other conditions. How is this treated? This condition is treated by making healthy lifestyle changes, such as eating healthy foods, exercising more, and reducing your alcohol intake. You may be referred for counseling on a healthy diet and physical activity. Your health care provider may prescribe medicine if lifestyle changes are not enough to get your blood pressure under control and if: Your systolic blood pressure is above 130. Your diastolic blood pressure is above 80. Your personal target blood pressure may vary depending on your medical conditions, your age, and other factors. Follow these instructions at home: Eating and drinking  Eat a diet that is high in fiber and potassium, and low in sodium, added sugar, and fat. An example of this eating plan is called the DASH diet. DASH stands for Dietary Approaches to Stop Hypertension. To eat this way: Eat   plenty of fresh fruits and vegetables. Try to fill one half of your plate at each meal with fruits and vegetables. Eat whole grains, such as whole-wheat pasta, brown rice, or whole-grain bread. Fill about one  fourth of your plate with whole grains. Eat or drink low-fat dairy products, such as skim milk or low-fat yogurt. Avoid fatty cuts of meat, processed or cured meats, and poultry with skin. Fill about one fourth of your plate with lean proteins, such as fish, chicken without skin, beans, eggs, or tofu. Avoid pre-made and processed foods. These tend to be higher in sodium, added sugar, and fat. Reduce your daily sodium intake. Many people with hypertension should eat less than 1,500 mg of sodium a day. Do not drink alcohol if: Your health care provider tells you not to drink. You are pregnant, may be pregnant, or are planning to become pregnant. If you drink alcohol: Limit how much you have to: 0-1 drink a day for women. 0-2 drinks a day for men. Know how much alcohol is in your drink. In the [place] one drink equals one 12 oz bottle of beer (355 mL), one 5 oz glass of wine (148 mL), or one 1 oz glass of hard liquor (44 mL). Lifestyle  Work with your health care provider to maintain a healthy body weight or to lose weight. Ask what an ideal weight is for you. Get at least 30 minutes of exercise that causes your heart to beat faster (aerobic exercise) most days of the week. Activities may include walking, swimming, or biking. Include exercise to strengthen your muscles (resistance exercise), such as Pilates or lifting weights, as part of your weekly exercise routine. Try to do these types of exercises for 30 minutes at least 3 days a week. Do not use any products that contain nicotine or tobacco. These products include cigarettes, chewing tobacco, and vaping devices, such as e-cigarettes. If you need help quitting, ask your health care provider. Monitor your blood pressure at home as told by your health care provider. Keep all follow-up visits. This is important. Medicines Take over-the-counter and prescription medicines only as told by your health care provider. Follow directions carefully. Blood  pressure medicines must be taken as prescribed. Do not skip doses of blood pressure medicine. Doing this puts you at risk for problems and can make the medicine less effective. Ask your health care provider about side effects or reactions to medicines that you should watch for. Contact a health care provider if you: Think you are having a reaction to a medicine you are taking. Have headaches that keep coming back (recurring). Feel dizzy. Have swelling in your ankles. Have trouble with your vision. Get help right away if you: Develop a severe headache or confusion. Have unusual weakness or numbness. Feel faint. Have severe pain in your chest or abdomen. Vomit repeatedly. Have trouble breathing. These symptoms may be an emergency. Get help right away. Call 911. Do not wait to see if the symptoms will go away. Do not drive yourself to the hospital. Summary Hypertension is when the force of blood pumping through your arteries is too strong. If this condition is not controlled, it may put you at risk for serious complications. Your personal target blood pressure may vary depending on your medical conditions, your age, and other factors. For most people, a normal blood pressure is less than 120/80. Hypertension is treated with lifestyle changes, medicines, or a combination of both. Lifestyle changes include losing weight, eating a healthy,   low-sodium diet, exercising more, and limiting alcohol. This information is not intended to replace advice given to you by your health care provider. Make sure you discuss any questions you have with your health care provider. Document Revised: 03/25/2021 Document Reviewed: 03/25/2021 Elsevier Patient Education  2024 [employer]  

## 2023-05-20 NOTE — Progress Notes (Signed)
Holly Daugherty 57 y.o.   Chief Complaint  Patient presents with   Establish Care    Patient here to establish care did mention she [facility] her bp was elevated     HISTORY OF PRESENT ILLNESS: This is a 57 y.o. female first visit to this office, here to establish care with me. History of hypertension on lisinopril. BP Readings from Last 3 Encounters:  10/05/17 122/84     HPI   Prior to Admission medications   Medication Sig Start Date End Date Taking? Authorizing Provider  Ascorbic Acid (C-500/ROSE HIPS PO) Take 1 capsule by mouth daily.    [provider]  aspirin 81 MG chewable tablet Chew 162 mg by mouth as needed.    [provider]  Biotin 5000 MCG CAPS Take 1 capsule by mouth daily.    [provider]  lisinopril (PRINIVIL,ZESTRIL) 5 MG tablet Take 5 mg by mouth daily.    [provider]    No Known Allergies  Patient Active Problem List   Diagnosis Date Noted   Essential hypertension 10/05/2017   Hypokalemia 10/05/2017   TIA (transient ischemic attack) 10/04/2017    Past Medical History:  Diagnosis Date   Hypertension     Past Surgical History:  Procedure Laterality Date   BRAIN BIOPSY      Social History   Socioeconomic History   Marital status: Married    Spouse name: Not on file   Number of children: Not on file   Years of education: Not on file   Highest education level: Not on file  Occupational History   Not on file  Tobacco Use   Smoking status: Never   Smokeless tobacco: Never  Vaping Use   Vaping status: Never Used  Substance and Sexual Activity   Alcohol use: Yes    Comment: Occasionally.   Drug use: Never   Sexual activity: Never  Other Topics Concern   Not on file  Social History Narrative   Not on file   Social Drivers of Health   Financial Resource Strain: Not on file  Food Insecurity: Not on file  Transportation Needs: Not on file  Physical Activity: Not on file  Stress: Not  on file  Social Connections: Not on file  Intimate Partner Violence: Not on file    Family History  Problem Relation Age of Onset   Diabetes Mellitus II Mother    CAD Father      Review of Systems  Constitutional: Negative.  Negative for chills and fever.  HENT: Negative.  Negative for congestion and sore throat.   Respiratory: Negative.  Negative for cough and shortness of breath.   Cardiovascular: Negative.  Negative for chest pain and palpitations.  Gastrointestinal:  Negative for abdominal pain, diarrhea, nausea and vomiting.  Genitourinary: Negative.  Negative for dysuria and hematuria.  Musculoskeletal: Negative.   Skin: Negative.   Neurological: Negative.  Negative for dizziness and headaches.  All other systems reviewed and are negative.   Today's Vitals   05/20/23 1256  BP: (!) 150/92  Pulse: (!) 102  Temp: 97.9 F (36.6 C)  TempSrc: Oral  SpO2: 96%  Weight: 159 lb (72.1 kg)  Height: 5\' 4"  (1.626 m)   Body mass index is 27.29 kg/m.   Physical Exam Vitals reviewed.  Constitutional:      Appearance: Normal appearance.  HENT:     Head: Normocephalic.     Right Ear: Tympanic membrane, ear canal and external ear  normal.     Left Ear: Tympanic membrane, ear canal and external ear normal.  Eyes:     Extraocular Movements: Extraocular movements intact.     Pupils: Pupils are equal, round, and reactive to light.  Cardiovascular:     Rate and Rhythm: Normal rate and regular rhythm.     Pulses: Normal pulses.     Heart sounds: Normal heart sounds.  Pulmonary:     Effort: Pulmonary effort is normal.     Breath sounds: Normal breath sounds.  Musculoskeletal:     Cervical back: No tenderness.  Lymphadenopathy:     Cervical: No cervical adenopathy.  Skin:    General: Skin is warm and dry.     Capillary Refill: Capillary refill takes less than 2 seconds.  Neurological:     General: No focal deficit present.     Mental Status: She is alert and oriented to  person, place, and time.  Psychiatric:        Mood and Affect: Mood normal.        Behavior: Behavior normal.      ASSESSMENT & PLAN: A total of 48 minutes was spent with the patient and counseling/coordination of care regarding preparing for this visit, review of available medical records, establishing care with me, diagnosis of hypertension and cardiovascular risks associated with this condition, review of medications and changes made, education on nutrition, need for blood work today, review of health maintenance items, prognosis, documentation and need for follow-up in 3 months.  Problem List Items Addressed This Visit       Cardiovascular and Mediastinum   Essential hypertension - Primary   Elevated blood pressure readings in the office Cardiovascular risks associated with uncontrolled hypertension discussed Dietary approaches to stop hypertension discussed Recommend to start lisinopril 10 mg daily Advised to monitor blood pressure readings at home daily for the next several weeks and keep a log.  Advised to contact the office if numbers persistently abnormal. Blood work today. Follow-up in 3 months.      Relevant Medications   lisinopril (ZESTRIL) 10 MG tablet   Other Relevant Orders   CBC with Differential/Platelet   Comprehensive metabolic panel   Hemoglobin A1c   Lipid panel   Other Visit Diagnoses       Encounter to establish care          Patient Instructions  Hypertension, Adult High blood pressure (hypertension) is when the force of blood pumping through the arteries is too strong. The arteries are the blood vessels that carry blood from the heart throughout the body. Hypertension forces the heart to work harder to pump blood and may cause arteries to become narrow or stiff. Untreated or uncontrolled hypertension can lead to a heart attack, heart failure, a stroke, kidney disease, and other problems. A blood pressure reading consists of a higher number over a  lower number. Ideally, your blood pressure should be below 120/80. The first ("top") number is called the systolic pressure. It is a measure of the pressure in your arteries as your heart beats. The second ("bottom") number is called the diastolic pressure. It is a measure of the pressure in your arteries as the heart relaxes. What are the causes? The exact cause of this condition is not known. There are some conditions that result in high blood pressure. What increases the risk? Certain factors may make you more likely to develop high blood pressure. Some of these risk factors are under your control, including: Smoking. Not  getting enough exercise or physical activity. Being overweight. Having too much fat, sugar, calories, or salt (sodium) in your diet. Drinking too much alcohol. Other risk factors include: Having a personal history of heart disease, diabetes, high cholesterol, or kidney disease. Stress. Having a family history of high blood pressure and high cholesterol. Having obstructive sleep apnea. Age. The risk increases with age. What are the signs or symptoms? High blood pressure may not cause symptoms. Very high blood pressure (hypertensive crisis) may cause: Headache. Fast or irregular heartbeats (palpitations). Shortness of breath. Nosebleed. Nausea and vomiting. Vision changes. Severe chest pain, dizziness, and seizures. How is this diagnosed? This condition is diagnosed by measuring your blood pressure while you are seated, with your arm resting on a flat surface, your legs uncrossed, and your feet flat on the floor. The cuff of the blood pressure monitor will be placed directly against the skin of your upper arm at the level of your heart. Blood pressure should be measured at least twice using the same arm. Certain conditions can cause a difference in blood pressure between your right and left arms. If you have a high blood pressure reading during one visit or you have  normal blood pressure with other risk factors, you may be asked to: Return on a different day to have your blood pressure checked again. Monitor your blood pressure at home for 1 week or longer. If you are diagnosed with hypertension, you may have other blood or imaging tests to help your health care provider understand your overall risk for other conditions. How is this treated? This condition is treated by making healthy lifestyle changes, such as eating healthy foods, exercising more, and reducing your alcohol intake. You may be referred for counseling on a healthy diet and physical activity. Your health care provider may prescribe medicine if lifestyle changes are not enough to get your blood pressure under control and if: Your systolic blood pressure is above 130. Your diastolic blood pressure is above 80. Your personal target blood pressure may vary depending on your medical conditions, your age, and other factors. Follow these instructions at home: Eating and drinking  Eat a diet that is high in fiber and potassium, and low in sodium, added sugar, and fat. An example of this eating plan is called the DASH diet. DASH stands for Dietary Approaches to Stop Hypertension. To eat this way: Eat plenty of fresh fruits and vegetables. Try to fill one half of your plate at each meal with fruits and vegetables. Eat whole grains, such as whole-wheat pasta, brown rice, or whole-grain bread. Fill about one fourth of your plate with whole grains. Eat or drink low-fat dairy products, such as skim milk or low-fat yogurt. Avoid fatty cuts of meat, processed or cured meats, and poultry with skin. Fill about one fourth of your plate with lean proteins, such as fish, chicken without skin, beans, eggs, or tofu. Avoid pre-made and processed foods. These tend to be higher in sodium, added sugar, and fat. Reduce your daily sodium intake. Many people with hypertension should eat less than 1,500 mg of sodium a  day. Do not drink alcohol if: Your health care provider tells you not to drink. You are pregnant, may be pregnant, or are planning to become pregnant. If you drink alcohol: Limit how much you have to: 0-1 drink a day for women. 0-2 drinks a day for men. Know how much alcohol is in your drink. In the [place] one drink equals one 12 oz  bottle of beer (355 mL), one 5 oz glass of wine (148 mL), or one 1 oz glass of hard liquor (44 mL). Lifestyle  Work with your health care provider to maintain a healthy body weight or to lose weight. Ask what an ideal weight is for you. Get at least 30 minutes of exercise that causes your heart to beat faster (aerobic exercise) most days of the week. Activities may include walking, swimming, or biking. Include exercise to strengthen your muscles (resistance exercise), such as Pilates or lifting weights, as part of your weekly exercise routine. Try to do these types of exercises for 30 minutes at least 3 days a week. Do not use any products that contain nicotine or tobacco. These products include cigarettes, chewing tobacco, and vaping devices, such as e-cigarettes. If you need help quitting, ask your health care provider. Monitor your blood pressure at home as told by your health care provider. Keep all follow-up visits. This is important. Medicines Take over-the-counter and prescription medicines only as told by your health care provider. Follow directions carefully. Blood pressure medicines must be taken as prescribed. Do not skip doses of blood pressure medicine. Doing this puts you at risk for problems and can make the medicine less effective. Ask your health care provider about side effects or reactions to medicines that you should watch for. Contact a health care provider if you: Think you are having a reaction to a medicine you are taking. Have headaches that keep coming back (recurring). Feel dizzy. Have swelling in your ankles. Have trouble with your  vision. Get help right away if you: Develop a severe headache or confusion. Have unusual weakness or numbness. Feel faint. Have severe pain in your chest or abdomen. Vomit repeatedly. Have trouble breathing. These symptoms may be an emergency. Get help right away. Call 911. Do not wait to see if the symptoms will go away. Do not drive yourself to the hospital. Summary Hypertension is when the force of blood pumping through your arteries is too strong. If this condition is not controlled, it may put you at risk for serious complications. Your personal target blood pressure may vary depending on your medical conditions, your age, and other factors. For most people, a normal blood pressure is less than 120/80. Hypertension is treated with lifestyle changes, medicines, or a combination of both. Lifestyle changes include losing weight, eating a healthy, low-sodium diet, exercising more, and limiting alcohol. This information is not intended to replace advice given to you by your health care provider. Make sure you discuss any questions you have with your health care provider. Document Revised: 03/25/2021 Document Reviewed: 03/25/2021 Elsevier Patient Education  2024 Elsevier Inc.     Edwina Barth, MD [facility] at [facility]

## 2023-05-24 ENCOUNTER — Encounter: Payer: Self-pay | Admitting: Emergency Medicine

## 2023-06-07 NOTE — Telephone Encounter (Signed)
 Acceptable numbers.  Thanks.

## 2023-08-18 ENCOUNTER — Encounter: Payer: Self-pay | Admitting: Emergency Medicine

## 2023-08-18 ENCOUNTER — Ambulatory Visit: Payer: BC Managed Care – PPO | Admitting: Emergency Medicine

## 2023-08-18 VITALS — BP 132/90 | HR 85 | Temp 98.2°F | Ht 64.0 in | Wt 157.0 lb

## 2023-08-18 DIAGNOSIS — Z1211 Encounter for screening for malignant neoplasm of colon: Secondary | ICD-10-CM

## 2023-08-18 DIAGNOSIS — I10 Essential (primary) hypertension: Secondary | ICD-10-CM

## 2023-08-18 DIAGNOSIS — Z23 Encounter for immunization: Secondary | ICD-10-CM | POA: Diagnosis not present

## 2023-08-18 NOTE — Patient Instructions (Signed)
 Mantenimiento de Radiographer, therapeutic en las mujeres Health Maintenance, Female Adoptar un estilo de vida saludable y recibir atencin preventiva son importantes para promover la salud y Counsellor. Consulte al mdico sobre: El esquema adecuado para hacerse pruebas y exmenes peridicos. Cosas que puede hacer por su cuenta para prevenir enfermedades y [place] sano. Qu debo saber sobre la dieta, el peso y el ejercicio? Consuma una dieta saludable  Consuma una dieta que incluya muchas verduras, frutas, productos lcteos con bajo contenido de [place] y Associate Professor. No consuma muchos alimentos ricos en grasas slidas, azcares agregados o sodio. Mantenga un peso saludable El ndice de masa muscular [facility]) se [place] para identificar problemas de [place]. Proporciona una estimacin de la grasa corporal basndose en el peso y la altura. Su mdico puede ayudarle a Engineer, site [facility]MC y a Personnel officer o Pharmacologist un peso saludable. Haga ejercicio con regularidad Haga ejercicio con regularidad. Esta es una de las prcticas ms importantes que puede hacer por su salud. La [employer] de los adultos deben seguir estas pautas: Education officer, environmental, al menos, 150 minutos de actividad fsica por semana. El ejercicio debe aumentar la frecuencia cardaca y Media planner transpirar (ejercicio de intensidad moderada). Hacer ejercicios de fortalecimiento por lo [employer] por semana. Agregue esto a su plan de ejercicio de intensidad moderada. Pase menos tiempo sentada. Incluso la actividad fsica ligera puede ser beneficiosa. Controle sus niveles de colesterol y lpidos en la sangre Comience a realizarse anlisis de lpidos y Oncologist en la sangre a los 20 aos y luego reptalos cada 5 aos. Hgase controlar los niveles de colesterol con mayor frecuencia si: Sus niveles de lpidos y colesterol son altos. Es mayor de 40 aos. Presenta un alto riesgo de padecer enfermedades cardacas. Qu debo saber sobre las pruebas de deteccin del  cncer? Segn su historia clnica y sus antecedentes familiares, es posible que deba realizarse pruebas de deteccin del cncer en diferentes edades. Esto puede incluir pruebas de deteccin de lo siguiente: Cncer de mama. Cncer de cuello uterino. Cncer colorrectal. Cncer de piel. Cncer de pulmn. Qu debo saber sobre la enfermedad cardaca, la diabetes y la hipertensin arterial? Presin arterial y enfermedad cardaca La hipertensin arterial causa enfermedades cardacas y [place] el riesgo de accidente cerebrovascular. Es ms probable que esto se manifieste en las personas que tienen lecturas de presin arterial alta o tienen sobrepeso. Hgase controlar la presin arterial: Cada 3 a 5 aos si tiene entre 18 y 50 aos. Todos los aos si es mayor de 40 aos. Diabetes Realcese exmenes de deteccin de la diabetes con regularidad. Este anlisis revisa el nivel de azcar en la sangre en [place]. Hgase las pruebas de deteccin: Cada tres aos despus de los 40 aos de edad si tiene un peso normal y un bajo riesgo de padecer diabetes. Con ms frecuencia y a partir de [place] edad inferior si tiene sobrepeso o un alto riesgo de padecer diabetes. Qu debo saber sobre la prevencin de infecciones? Hepatitis B Si tiene un riesgo ms alto de contraer hepatitis B, debe someterse a un examen de deteccin de este virus. Hable con el mdico para averiguar si tiene riesgo de contraer la infeccin por hepatitis B. Hepatitis C Se recomienda el anlisis a: [employer] 1944 y 1965. Todas las personas que tengan un riesgo de haber contrado hepatitis C. Enfermedades de transmisin sexual (ETS) Hgase las pruebas de Airline pilot de ITS, incluidas la gonorrea y la clamidia, si: Es sexualmente activa y es Adult nurse de 24  aos. Es mayor de 24 aos, y el mdico le informa que corre riesgo de tener este tipo de infecciones. La actividad sexual ha cambiado desde que le hicieron la ltima prueba de  deteccin y tiene un riesgo mayor de Warehouse manager clamidia o Copy. Pregntele al mdico si usted tiene riesgo. Pregntele al mdico si usted tiene un alto riesgo de Primary school teacher VIH. El mdico tambin puede recomendarle un medicamento recetado para ayudar a evitar la infeccin por el VIH. Si elige tomar medicamentos para prevenir el VIH, primero debe [employer] de deteccin del VIH. Luego debe hacerse anlisis cada 3 meses mientras est tomando los medicamentos. Embarazo Si est por dejar de Armed forces training and education officer (fase premenopusica) y usted puede quedar [place], busque asesoramiento antes de [place]. [place] de 400 a 800 microgramos (mcg) de cido [employer] si [place]. Pida mtodos de control de la natalidad (anticonceptivos) si desea evitar un embarazo no deseado. Osteoporosis y [place] La osteoporosis es una enfermedad en la que los huesos pierden los minerales y la fuerza por el avance de la edad. El resultado pueden ser fracturas en los [place]. Si tiene 65 aos o ms, o si est en riesgo de sufrir osteoporosis y fracturas, pregunte a su mdico si debe: Hacerse pruebas de deteccin de prdida sea. Tomar un suplemento de calcio o de vitamina D para reducir el riesgo de fracturas. Recibir terapia de reemplazo hormonal (TRH) para tratar los sntomas de la menopausia. Siga estas indicaciones en su casa: Consumo de alcohol No beba alcohol si: Su mdico le indica no hacerlo. Est embarazada, puede estar embarazada o est tratando de [place]. Si bebe alcohol: Limite la cantidad que bebe a lo siguiente: De 0 a 1 bebida por da. Sepa cunta cantidad de alcohol hay en las bebidas que toma. En los [address], una medida equivale a una botella de cerveza de 12 oz (355 ml), un vaso de vino de 5 oz (148 ml) o un vaso de una bebida alcohlica de alta graduacin de 1 oz (44 ml). Estilo de vida No consuma ningn producto que contenga nicotina o tabaco. Estos  productos incluyen cigarrillos, tabaco para Theatre manager y aparatos de vapeo, como los Administrator, Civil Service. Si necesita ayuda para dejar de consumir estos productos, consulte al mdico. No consuma drogas. No comparta agujas. Solicite ayuda a su mdico si necesita apoyo o informacin para abandonar las drogas. Indicaciones generales Realcese los estudios de rutina de [address], dentales y de Wellsite geologist. Mantngase al da con las vacunas. Infrmele a su mdico si: Se siente deprimida con frecuencia. Alguna vez ha sido vctima de [place] o no se siente seguro en su casa. Resumen Adoptar un estilo de vida saludable y recibir atencin preventiva son importantes para promover la salud y Counsellor. Siga las instrucciones del mdico acerca de una dieta saludable, el ejercicio y la realizacin de pruebas o exmenes para Hotel manager. Siga las instrucciones del mdico con respecto al control del colesterol y la presin arterial. Esta informacin no tiene Theme park manager el consejo del mdico. Asegrese de hacerle al mdico cualquier pregunta que tenga. Document Revised: 10/24/2020 Document Reviewed: 10/24/2020 Elsevier Patient Education  2024 [employer].

## 2023-08-18 NOTE — Assessment & Plan Note (Signed)
 Well-controlled hypertension Normal blood pressure readings at home Continue lisinopril 10 mg daily Cardiovascular risks associated with hypertension discussed Dietary approaches to stop hypertension discussed Benefits of exercise discussed Follow-up in 6 months

## 2023-08-18 NOTE — Progress Notes (Signed)
 Holly Daugherty 58 y.o.   Chief Complaint  Patient presents with   Follow-up    3 month f/u HTN. Patient states she's been checking her bp at home at night and it has been good.     HISTORY OF PRESENT ILLNESS: This is a 58 y.o. female here for follow-up of hypertension Taking lisinopril every day Normal blood pressure readings at home No complaints or any other medical concerns today.  HPI   Prior to Admission medications   Medication Sig Start Date End Date Taking? Authorizing Provider  lisinopril (ZESTRIL) 10 MG tablet Take 1 tablet (10 mg total) by mouth daily. 05/20/23  Yes Nickolas Chalfin, Eilleen Kempf, MD  Ascorbic Acid (C-500/ROSE HIPS PO) Take 1 capsule by mouth daily. Patient not taking: Reported on 08/18/2023    [provider]  aspirin 81 MG chewable tablet Chew 162 mg by mouth as needed. Patient not taking: Reported on 08/18/2023    [provider]  Biotin 5000 MCG CAPS Take 1 capsule by mouth daily. Patient not taking: Reported on 08/18/2023    [provider]  lisinopril (PRINIVIL,ZESTRIL) 5 MG tablet Take 5 mg by mouth daily. Patient not taking: Reported on 08/18/2023    [provider]    No Known Allergies  Patient Active Problem List   Diagnosis Date Noted   Essential hypertension 10/05/2017   Hypokalemia 10/05/2017   TIA (transient ischemic attack) 10/04/2017    Past Medical History:  Diagnosis Date   Hypertension     Past Surgical History:  Procedure Laterality Date   BRAIN BIOPSY     carparl tunnerl  Right    CHOLECYSTECTOMY, LAPAROSCOPIC     TUBAL LIGATION      Social History   Socioeconomic History   Marital status: Married    Spouse name: Elener Custodio   Number of children: 3   Years of education: Not on file   Highest education level: Not on file  Occupational History   Not on file  Tobacco Use   Smoking status: Never   Smokeless tobacco: Never  Vaping Use   Vaping status: Never Used  Substance and Sexual  Activity   Alcohol use: Yes    Alcohol/week: 1.0 standard drink of alcohol    Types: 1 Glasses of wine per week    Comment: Occasionally.   Drug use: Never   Sexual activity: Yes    Birth control/protection: None  Other Topics Concern   Not on file  Social History Narrative   Not on file   Social Drivers of Health   Financial Resource Strain: Not on file  Food Insecurity: Not on file  Transportation Needs: Not on file  Physical Activity: Not on file  Stress: Not on file  Social Connections: Not on file  Intimate Partner Violence: Not on file    Family History  Problem Relation Age of Onset   Diabetes Mellitus II Mother    Breast cancer Mother    CAD Father    Heart attack Father    Asthma Daughter    Asthma Son    Asthma Son      Review of Systems  Constitutional: Negative.  Negative for chills and fever.  HENT: Negative.  Negative for congestion and sore throat.   Respiratory: Negative.  Negative for cough and shortness of breath.   Cardiovascular: Negative.  Negative for chest pain and palpitations.  Gastrointestinal:  Negative for abdominal pain, diarrhea, nausea and vomiting.  Genitourinary: Negative.  Negative for  dysuria and hematuria.  Skin: Negative.  Negative for rash.  Neurological: Negative.  Negative for dizziness and headaches.  All other systems reviewed and are negative.   Vitals:   08/18/23 1520  BP: (!) 132/90  Pulse: 85  Temp: 98.2 F (36.8 C)  SpO2: 95%    Physical Exam Vitals reviewed.  Constitutional:      Appearance: Normal appearance.  HENT:     Head: Normocephalic.     Mouth/Throat:     Mouth: Mucous membranes are moist.     Pharynx: Oropharynx is clear.  Eyes:     Extraocular Movements: Extraocular movements intact.     Conjunctiva/sclera: Conjunctivae normal.     Pupils: Pupils are equal, round, and reactive to light.  Cardiovascular:     Rate and Rhythm: Normal rate and regular rhythm.     Pulses: Normal pulses.      Heart sounds: Normal heart sounds.  Pulmonary:     Effort: Pulmonary effort is normal.     Breath sounds: Normal breath sounds.  Musculoskeletal:     Cervical back: No tenderness.  Lymphadenopathy:     Cervical: No cervical adenopathy.  Skin:    General: Skin is warm and dry.     Capillary Refill: Capillary refill takes less than 2 seconds.  Neurological:     General: No focal deficit present.     Mental Status: She is alert and oriented to person, place, and time.  Psychiatric:        Mood and Affect: Mood normal.        Behavior: Behavior normal.      ASSESSMENT & PLAN: A total of 43 minutes was spent with the patient and counseling/coordination of care regarding preparing for this visit, review of most recent office visit notes, diagnosis of hypertension and cardiovascular risks associated with this condition, review of all medications, review of most recent bloodwork results, review of health maintenance items, education on nutrition, prognosis, documentation, and need for follow up.   Problem List Items Addressed This Visit       Cardiovascular and Mediastinum   Essential hypertension - Primary   Well-controlled hypertension Normal blood pressure readings at home Continue lisinopril 10 mg daily Cardiovascular risks associated with hypertension discussed Dietary approaches to stop hypertension discussed Benefits of exercise discussed Follow-up in 6 months      Other Visit Diagnoses       Need for vaccination       Relevant Orders   Tdap vaccine greater than or equal to 7yo IM (Completed)     Screening for colon cancer       Relevant Orders   Cologuard        Patient Instructions  Mantenimiento de Radiographer, therapeutic en las mujeres Health Maintenance, Female Adoptar un estilo de vida saludable y recibir atencin preventiva son importantes para promover la salud y Counsellor. Consulte al mdico sobre: El esquema adecuado para hacerse pruebas y exmenes  peridicos. Cosas que puede hacer por su cuenta para prevenir enfermedades y [place] sano. Qu debo saber sobre la dieta, el peso y el ejercicio? Consuma una dieta saludable  Consuma una dieta que incluya muchas verduras, frutas, productos lcteos con bajo contenido de [place] y Associate Professor. No consuma muchos alimentos ricos en grasas slidas, azcares agregados o sodio. Mantenga un peso saludable El ndice de masa muscular [facility]) se [place] para identificar problemas de [place]. Proporciona una estimacin de la grasa corporal basndose en el peso y la altura.  Su mdico puede ayudarle a Engineer, site IMC y a Personnel officer o Pharmacologist un peso saludable. Haga ejercicio con regularidad Haga ejercicio con regularidad. Esta es una de las prcticas ms importantes que puede hacer por su salud. La [employer] de los adultos deben seguir estas pautas: Education officer, environmental, al menos, 150 minutos de actividad fsica por semana. El ejercicio debe aumentar la frecuencia cardaca y Media planner transpirar (ejercicio de intensidad moderada). Hacer ejercicios de fortalecimiento por lo [employer] por semana. Agregue esto a su plan de ejercicio de intensidad moderada. Pase menos tiempo sentada. Incluso la actividad fsica ligera puede ser beneficiosa. Controle sus niveles de colesterol y lpidos en la sangre Comience a realizarse anlisis de lpidos y Oncologist en la sangre a los 20 aos y luego reptalos cada 5 aos. Hgase controlar los niveles de colesterol con mayor frecuencia si: Sus niveles de lpidos y colesterol son altos. Es mayor de 40 aos. Presenta un alto riesgo de padecer enfermedades cardacas. Qu debo saber sobre las pruebas de deteccin del cncer? Segn su historia clnica y sus antecedentes familiares, es posible que deba realizarse pruebas de deteccin del cncer en diferentes edades. Esto puede incluir pruebas de deteccin de lo siguiente: Cncer de mama. Cncer de cuello uterino. Cncer  colorrectal. Cncer de piel. Cncer de pulmn. Qu debo saber sobre la enfermedad cardaca, la diabetes y la hipertensin arterial? Presin arterial y enfermedad cardaca La hipertensin arterial causa enfermedades cardacas y [place] el riesgo de accidente cerebrovascular. Es ms probable que esto se manifieste en las personas que tienen lecturas de presin arterial alta o tienen sobrepeso. Hgase controlar la presin arterial: Cada 3 a 5 aos si tiene entre 18 y 32 aos. Todos los aos si es mayor de 40 aos. Diabetes Realcese exmenes de deteccin de la diabetes con regularidad. Este anlisis revisa el nivel de azcar en la sangre en [place]. Hgase las pruebas de deteccin: Cada tres aos despus de los 40 aos de edad si tiene un peso normal y un bajo riesgo de padecer diabetes. Con ms frecuencia y a partir de [place] edad inferior si tiene sobrepeso o un alto riesgo de padecer diabetes. Qu debo saber sobre la prevencin de infecciones? Hepatitis B Si tiene un riesgo ms alto de contraer hepatitis B, debe someterse a un examen de deteccin de este virus. Hable con el mdico para averiguar si tiene riesgo de contraer la infeccin por hepatitis B. Hepatitis C Se recomienda el anlisis a: [employer] 1945 y 1965. Todas las personas que tengan un riesgo de haber contrado hepatitis C. Enfermedades de transmisin sexual (ETS) Hgase las pruebas de Airline pilot de ITS, incluidas la gonorrea y la clamidia, si: Es sexualmente activa y es menor de [address]. Es mayor de [address], y Public affairs consultant informa que corre riesgo de tener este tipo de infecciones. La actividad sexual ha cambiado desde que le hicieron la ltima prueba de deteccin y tiene un riesgo mayor de Warehouse manager clamidia o Copy. Pregntele al mdico si usted tiene riesgo. Pregntele al mdico si usted tiene un alto riesgo de Primary school teacher VIH. El mdico tambin puede recomendarle un medicamento recetado para ayudar a evitar la  infeccin por el VIH. Si elige tomar medicamentos para prevenir el VIH, primero debe [employer] de deteccin del VIH. Luego debe hacerse anlisis cada 3 meses mientras est tomando los medicamentos. Embarazo Si est por dejar de Armed forces training and education officer (fase premenopusica) y usted puede quedar [place], busque asesoramiento antes de [place]. [place] de  400 a 800 microgramos (mcg) de cido flico todos los [address] si [place]. Pida mtodos de control de la natalidad (anticonceptivos) si desea evitar un embarazo no deseado. Osteoporosis y [place] La osteoporosis es una enfermedad en la que los huesos pierden los minerales y la fuerza por el avance de la edad. El resultado pueden ser fracturas en los [place]. Si tiene 65 aos o ms, o si est en riesgo de sufrir osteoporosis y fracturas, pregunte a su mdico si debe: Hacerse pruebas de deteccin de prdida sea. Tomar un suplemento de calcio o de vitamina D para reducir el riesgo de fracturas. Recibir terapia de reemplazo hormonal (TRH) para tratar los sntomas de la menopausia. Siga estas indicaciones en su casa: Consumo de alcohol No beba alcohol si: Su mdico le indica no hacerlo. Est embarazada, puede estar embarazada o est tratando de [place]. Si bebe alcohol: Limite la cantidad que bebe a lo siguiente: De 0 a 1 bebida por da. Sepa cunta cantidad de alcohol hay en las bebidas que toma. En los [address], una medida equivale a una botella de cerveza de 12 oz (355 ml), un vaso de vino de 5 oz (148 ml) o un vaso de una bebida alcohlica de alta graduacin de 1 oz (44 ml). Estilo de vida No consuma ningn producto que contenga nicotina o tabaco. Estos productos incluyen cigarrillos, tabaco para Theatre manager y aparatos de vapeo, como los Administrator, Civil Service. Si necesita ayuda para dejar de consumir estos productos, consulte al mdico. No consuma drogas. No comparta agujas. Solicite ayuda a su mdico si necesita  apoyo o informacin para abandonar las drogas. Indicaciones generales Realcese los estudios de rutina de [address], dentales y de Wellsite geologist. Mantngase al da con las vacunas. Infrmele a su mdico si: Se siente deprimida con frecuencia. Alguna vez ha sido vctima de [place] o no se siente seguro en su casa. Resumen Adoptar un estilo de vida saludable y recibir atencin preventiva son importantes para promover la salud y Counsellor. Siga las instrucciones del mdico acerca de una dieta saludable, el ejercicio y la realizacin de pruebas o exmenes para Hotel manager. Siga las instrucciones del mdico con respecto al control del colesterol y la presin arterial. Esta informacin no tiene Theme park manager el consejo del mdico. Asegrese de hacerle al mdico cualquier pregunta que tenga. Document Revised: 10/24/2020 Document Reviewed: 10/24/2020 Elsevier Patient Education  2024 Elsevier Inc.    Edwina Barth, MD [facility]

## 2023-08-23 ENCOUNTER — Encounter: Payer: Self-pay | Admitting: Emergency Medicine

## 2023-08-23 NOTE — Telephone Encounter (Signed)
 I sent a Cologuard test because health maintenance items identified colon cancer screening as deficient. She is wondering why we are sending a Cologuard test kit.  Blood pressure readings look well.

## 2023-08-25 NOTE — Telephone Encounter (Signed)
 Her wishes.  Thank you.

## 2023-08-27 LAB — COLOGUARD

## 2023-11-02 DIAGNOSIS — L570 Actinic keratosis: Secondary | ICD-10-CM | POA: Diagnosis not present

## 2023-11-02 DIAGNOSIS — L821 Other seborrheic keratosis: Secondary | ICD-10-CM | POA: Diagnosis not present

## 2023-11-02 DIAGNOSIS — L814 Other melanin hyperpigmentation: Secondary | ICD-10-CM | POA: Diagnosis not present

## 2023-11-25 NOTE — Progress Notes (Signed)
 New patient visit   Patient: Holly Daugherty   DOB: May 09, 1966   58 y.o. Female  MRN: [id number] Visit Date: 11/26/2023  Today's healthcare provider: Manuelita Flatness, PA-C   Cc. New pt establish care  Subjective    Holly Daugherty is a 58 y.o. female who presents today as a new patient to establish care.   Discussed the use of AI scribe software for clinical note transcription with the patient, who gave verbal consent to proceed.  History of Present Illness   Holly Daugherty is a 58 year old female with hypertension who presents for establishing care and blood pressure management.  She is concerned about her blood pressure management despite lifestyle changes, including stopping coffee. Her blood pressure was high this morning. She used to monitors it three to four times daily, noting fluctuations with better readings at night after relaxation and exercise. She is concerned about higher readings at work and considers stress as a possible factor. Readings for April, in the evening, 110s-120s / 70s-80s.   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   In 2019, she was hospitalized for low potassium, initially suspected as a mini-stroke, but attributed to low potassium levels. She was treated with potassium supplementation and has been stable since. No other incidents with TIA like symptoms.  Her family history includes heart disease, with both parents having died from heart-related issues, and diabetes.   She has not been on cholesterol medication and maintains good cholesterol levels. She has a history of prediabetes.  She drinks a lot of water and questions its effect on her potassium levels.      Past Medical History:  Diagnosis Date   Hypertension    Past Surgical History:  Procedure Laterality Date   BRAIN BIOPSY     carparl tunnerl  Right    CHOLECYSTECTOMY,  LAPAROSCOPIC     TUBAL LIGATION     Family Status  Relation Name Status   Mother  Deceased   Father  Deceased   Daughter  Alive   Son  Alive   Son  Alive   MGM  Deceased   MGF  Deceased   PGM  Deceased   PGF  Deceased  No partnership data on file   Family History  Problem Relation Age of Onset   Diabetes Mellitus II Mother    Breast cancer Mother    CAD Father    Heart attack Father    Asthma Daughter    Asthma Son    Asthma Son    Social History   Socioeconomic History   Marital status: Married    Spouse name: Madilyne Tadlock   Number of children: 3   Years of education: Not on file   Highest education level: Not on file  Occupational History   Not on file  Tobacco Use   Smoking status: Never   Smokeless tobacco: Never  Vaping Use   Vaping status: Never Used  Substance and Sexual Activity   Alcohol use: Yes    Alcohol/week: 1.0 standard drink of alcohol    Types: 1 Glasses of wine per week    Comment: Occasionally.   Drug use: Never   Sexual activity: Yes    Birth control/protection: None  Other Topics Concern   Not on file  Social History Narrative   Not on file   Social Drivers of  Health   Financial Resource Strain: Not on file  Food Insecurity: Not on file  Transportation Needs: Not on file  Physical Activity: Not on file  Stress: Not on file  Social Connections: Not on file   Outpatient Medications Prior to Visit  Medication Sig   lisinopril  (ZESTRIL ) 10 MG tablet Take 1 tablet (10 mg total) by mouth daily.   [DISCONTINUED] Ascorbic Acid (C-500/ROSE HIPS PO) Take 1 capsule by mouth daily. (Patient not taking: Reported on 08/18/2023)   [DISCONTINUED] aspirin  81 MG chewable tablet Chew 162 mg by mouth as needed. (Patient not taking: Reported on 08/18/2023)   [DISCONTINUED] Biotin 5000 MCG CAPS Take 1 capsule by mouth daily. (Patient not taking: Reported on 08/18/2023)   [DISCONTINUED] lisinopril  (PRINIVIL ,ZESTRIL ) 5 MG tablet Take 5 mg by mouth daily.  (Patient not taking: Reported on 08/18/2023)   No facility-administered medications prior to visit.   No Known Allergies  Immunization History  Administered Date(s) Administered   Influenza,inj,Quad PF,6+ Mos 04/15/2017, 02/01/2018, 01/31/2019, 05/09/2020, 02/10/2021   Tdap 08/18/2023    Health Maintenance  Topic Date Due   Hepatitis C Screening  Never done   Hepatitis B Vaccines (1 of 3 - 19+ 3-dose series) Never done   COVID-19 Vaccine (1 - 2024-25 season) Never done   Zoster Vaccines- Shingrix (1 of 2) 05/23/2024 (Originally 07/27/2016)   INFLUENZA VACCINE  12/31/2023   MAMMOGRAM  03/11/2025   Cervical Cancer Screening (HPV/Pap Cotest)  03/11/2026   Colonoscopy  12/05/2029   DTaP/Tdap/Td (2 - Td or Tdap) 08/17/2033   HIV Screening  Completed   HPV VACCINES  Aged Out   Meningococcal B Vaccine  Aged Out    Patient Care Team: Cyndi Shaver, PA-C as PCP - General (Physician Assistant)  Review of Systems  Constitutional:  Negative for fatigue and fever.  Respiratory:  Negative for cough and shortness of breath.   Cardiovascular:  Negative for chest pain and leg swelling.  Gastrointestinal:  Negative for abdominal pain.  Neurological:  Negative for dizziness and headaches.        Objective    BP 137/84   Pulse 70   Ht 5' 4 (1.626 m)   Wt 158 lb 3.2 oz (71.8 kg)   BMI 27.15 kg/m     Physical Exam Constitutional:      General: She is awake.     Appearance: She is well-developed.  HENT:     Head: Normocephalic.   Eyes:     Conjunctiva/sclera: Conjunctivae normal.    Cardiovascular:     Rate and Rhythm: Normal rate and regular rhythm.     Heart sounds: Normal heart sounds.  Pulmonary:     Effort: Pulmonary effort is normal.     Breath sounds: Normal breath sounds.   Skin:    General: Skin is warm.   Neurological:     Mental Status: She is alert and oriented to person, place, and time.   Psychiatric:        Attention and Perception: Attention  normal.        Mood and Affect: Mood normal.        Speech: Speech normal.        Behavior: Behavior is cooperative.     Depression Screen    11/26/2023    8:28 AM 08/18/2023    3:29 PM 05/20/2023    1:08 PM  PHQ 2/9 Scores  PHQ - 2 Score 0 0 0   Results for orders placed or performed in  visit on 11/26/23  HM MAMMOGRAPHY  Result Value Ref Range   HM Mammogram 0-4 Bi-Rad 0-4 Bi-Rad, Self Reported Normal  HM PAP SMEAR  Result Value Ref Range   HM Pap smear neg     Assessment & Plan     Essential hypertension Assessment & Plan: Moderately controlled in office, but from last month's records, well controlled at home Cont to monitor, fu 8 weeks to review home vs in office BP Cont lisinopril  10 mg    Prediabetes Assessment & Plan: Last A1c 6.1%, 12/24. Repeat A1c today  Orders: -     Hemoglobin A1c  Hypokalemia Assessment & Plan: Historically, repeat bmp  Orders: -     Basic metabolic panel with GFR  TIA (transient ischemic attack) Assessment & Plan: Per pt and reviewing notes from last PCP -- not a TIA, neuro symptoms may have been 2/2 to hypokalemia.  Pt not anticoagulated.   Aortic atherosclerosis (HCC) Assessment & Plan: Seen on lung ca screening CT, LDL is normal. Discussed cardiac calcium score, pt thinks she might have done this before. Will obtain records and send  The 10-year ASCVD risk score (Arnett DK, et al., 2019) is: 3.4%      Return in about 6 months (around 05/27/2024) for chronic conditions.      Manuelita Flatness, PA-C  [facility] [phone] (phone) [phone] (fax)  [facility]

## 2023-11-26 ENCOUNTER — Ambulatory Visit: Admitting: Physician Assistant

## 2023-11-26 ENCOUNTER — Encounter: Payer: Self-pay | Admitting: Physician Assistant

## 2023-11-26 VITALS — BP 137/84 | HR 70 | Ht 64.0 in | Wt 158.2 lb

## 2023-11-26 DIAGNOSIS — E876 Hypokalemia: Secondary | ICD-10-CM

## 2023-11-26 DIAGNOSIS — R7303 Prediabetes: Secondary | ICD-10-CM | POA: Diagnosis not present

## 2023-11-26 DIAGNOSIS — G459 Transient cerebral ischemic attack, unspecified: Secondary | ICD-10-CM | POA: Diagnosis not present

## 2023-11-26 DIAGNOSIS — I700 Atherosclerosis of aorta: Secondary | ICD-10-CM | POA: Insufficient documentation

## 2023-11-26 DIAGNOSIS — I10 Essential (primary) hypertension: Secondary | ICD-10-CM

## 2023-11-26 LAB — BASIC METABOLIC PANEL WITH GFR
BUN: 8 mg/dL (ref 6–23)
CO2: 26 meq/L (ref 19–32)
Calcium: 9.2 mg/dL (ref 8.4–10.5)
Chloride: 107 meq/L (ref 96–112)
Creatinine, Ser: 0.73 mg/dL (ref 0.40–1.20)
GFR: 91.35 mL/min (ref >60.00)
Glucose, Bld: 106 mg/dL — ABNORMAL HIGH (ref 70–99)
Potassium: 3.8 meq/L (ref 3.5–5.1)
Sodium: 140 meq/L (ref 135–145)

## 2023-11-26 LAB — HEMOGLOBIN A1C: Hgb A1c MFr Bld: 6 % (ref 4.6–6.5)

## 2023-11-26 NOTE — Assessment & Plan Note (Signed)
 Moderately controlled in office, but from last month's records, well controlled at home Cont to monitor, fu 8 weeks to review home vs in office BP Cont lisinopril  10 mg

## 2023-11-26 NOTE — Assessment & Plan Note (Signed)
 Per pt and reviewing notes from last PCP -- not a TIA, neuro symptoms may have been 2/2 to hypokalemia.  Pt not anticoagulated.

## 2023-11-26 NOTE — Assessment & Plan Note (Signed)
 Historically, repeat bmp

## 2023-11-26 NOTE — Assessment & Plan Note (Signed)
 Seen on lung ca screening CT, LDL is normal. Discussed cardiac calcium score, pt thinks she might have done this before. Will obtain records and send  The 10-year ASCVD risk score (Arnett DK, et al., 2019) is: 3.4%

## 2023-11-26 NOTE — Assessment & Plan Note (Addendum)
 Last A1c 6.1%, 12/24. Repeat A1c today

## 2023-11-29 ENCOUNTER — Ambulatory Visit: Payer: Self-pay | Admitting: Physician Assistant

## 2023-12-01 ENCOUNTER — Other Ambulatory Visit: Payer: Self-pay | Admitting: Physician Assistant

## 2023-12-01 DIAGNOSIS — Z8249 Family history of ischemic heart disease and other diseases of the circulatory system: Secondary | ICD-10-CM

## 2023-12-01 DIAGNOSIS — I700 Atherosclerosis of aorta: Secondary | ICD-10-CM

## 2023-12-01 DIAGNOSIS — I10 Essential (primary) hypertension: Secondary | ICD-10-CM

## 2023-12-08 ENCOUNTER — Ambulatory Visit (HOSPITAL_BASED_OUTPATIENT_CLINIC_OR_DEPARTMENT_OTHER)
Admission: RE | Admit: 2023-12-08 | Discharge: 2023-12-08 | Disposition: A | Discharge status: Home / Self Care | Payer: Self-pay | Source: Ambulatory Visit | Attending: Physician Assistant | Admitting: Physician Assistant

## 2023-12-08 DIAGNOSIS — Z8249 Family history of ischemic heart disease and other diseases of the circulatory system: Secondary | ICD-10-CM | POA: Insufficient documentation

## 2023-12-08 DIAGNOSIS — I10 Essential (primary) hypertension: Secondary | ICD-10-CM | POA: Insufficient documentation

## 2023-12-08 DIAGNOSIS — I700 Atherosclerosis of aorta: Secondary | ICD-10-CM | POA: Insufficient documentation

## 2023-12-09 ENCOUNTER — Ambulatory Visit: Payer: Self-pay | Admitting: Physician Assistant

## 2023-12-29 ENCOUNTER — Encounter: Payer: Self-pay | Admitting: Physician Assistant

## 2024-01-05 ENCOUNTER — Telehealth: Payer: Self-pay

## 2024-01-05 ENCOUNTER — Encounter: Payer: Self-pay | Admitting: Physician Assistant

## 2024-01-05 ENCOUNTER — Other Ambulatory Visit: Payer: Self-pay | Admitting: Physician Assistant

## 2024-01-05 NOTE — Telephone Encounter (Signed)
 Copied from CRM #[id number]. Topic: General - Other >> Jan 05, 2024  1:07 PM Lavanda D wrote: Reason for CRM: Patient just received the message about Manuelita Flatness leaving the practice. She would like to know if Manuelita has any recommendations for a replacement provider. She would like to know also if Amber is staying. She can receive messages on MyChart regarding her recommendations.

## 2024-02-01 ENCOUNTER — Encounter: Payer: Self-pay | Admitting: Family Medicine

## 2024-02-01 ENCOUNTER — Ambulatory Visit: Admitting: Family Medicine

## 2024-02-01 VITALS — BP 135/75 | HR 88 | Ht 64.0 in | Wt 155.0 lb

## 2024-02-01 DIAGNOSIS — R7303 Prediabetes: Secondary | ICD-10-CM | POA: Diagnosis not present

## 2024-02-01 DIAGNOSIS — Z0000 Encounter for general adult medical examination without abnormal findings: Secondary | ICD-10-CM | POA: Diagnosis not present

## 2024-02-01 DIAGNOSIS — I10 Essential (primary) hypertension: Secondary | ICD-10-CM

## 2024-02-01 MED ORDER — LISINOPRIL 10 MG PO TABS: 10.0000 mg | ORAL_TABLET | Freq: Every day | ORAL | 3 refills | Status: AC

## 2024-02-01 NOTE — Progress Notes (Signed)
 Established Patient Office Visit  Subjective   Patient ID: Holly Daugherty, female    DOB: [birth year]  Age: 58 y.o. MRN: [id number]  Chief Complaint  Patient presents with   Establish Care    HPI     Discussed the use of AI scribe software for clinical note transcription with the patient, who gave verbal consent to proceed.  History of Present Illness Holly Daugherty is a 58 year old female with hypertension who presents for a routine follow-up and transfer of care.  She has a history of hypertension and is currently taking lisinopril  10 mg once daily. Her blood pressure readings are typically different at night compared to during the day, with recent readings around 128/80 mmHg. No chest pain, unusual headaches, or swelling in her feet.  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 She underwent treatment and had chest x-rays to ensure no lung involvement. No current issues related to tuberculosis are reported.  She maintains a healthy lifestyle, engaging in regular physical activity such as walking after dinner and using an exercise room for stretching. She follows a balanced diet, typically consuming low-carb foods and monitoring her intake of coffee and sugar. A recent A1c was 6.0, and the patient recalls being told this is in the prediabetes range.            ROS All review of systems negative except what is listed in the HPI    Objective:     BP 135/75   Pulse 88   Ht 5' 4 (1.626 m)   Wt 155 lb (70.3 kg)   SpO2 98%   BMI 26.61 kg/m    Physical Exam Vitals reviewed.  Constitutional:      Appearance: Normal appearance.  Cardiovascular:     Rate and Rhythm: Normal rate and regular rhythm.     Heart sounds: Normal heart sounds.  Pulmonary:     Effort: Pulmonary effort is normal.     Breath sounds: Normal breath sounds.  Musculoskeletal:     Right lower leg: No edema.     Left lower leg: No edema.  Skin:    General:  Skin is warm and dry.  Neurological:     Mental Status: She is alert and oriented to person, place, and time.  Psychiatric:        Mood and Affect: Mood normal.        Behavior: Behavior normal.        Thought Content: Thought content normal.        Judgment: Judgment normal.      No results found for any visits on 02/01/24.    The 10-year ASCVD risk score (Arnett DK, et al., 2019) is: 3.3%    Assessment & Plan:   Problem List Items Addressed This Visit       Active Problems   Essential hypertension - Primary   Relevant Medications   lisinopril  (ZESTRIL ) 10 MG tablet   Prediabetes   Other Visit Diagnoses       Encounter for medical examination to establish care           Assessment & Plan Adult Wellness Visit Routine wellness visit. Previous colonoscopy normal. Mammogram and Pap smear done last year. Regular dental and eye check-ups maintained.  - Schedule physical exam  - Order blood work during physical exam. - Order mammogram during physical exam.  Essential hypertension Well-controlled on lisinopril  10 mg daily. Blood pressure consistently normal, recent reading 128/80  mmHg. No symptoms reported. - Transfer lisinopril  prescription to current provider. - Continue lisinopril  10 mg once daily.  Prediabetes A1c 6.0, indicating prediabetes. Maintains healthy diet and regular exercise. Family history of diabetes noted. Discussed monitoring and lifestyle modifications to prevent progression. - Monitor A1c every six months. - Continue low-carb diet and regular exercise.    Return in about 3 months (around 05/02/2024) for physical.    Waddell KATHEE Mon, NP

## 2024-02-22 ENCOUNTER — Ambulatory Visit: Admitting: Emergency Medicine

## 2024-05-03 ENCOUNTER — Encounter: Payer: Self-pay | Admitting: Family Medicine

## 2024-05-03 ENCOUNTER — Ambulatory Visit: Admitting: Family Medicine

## 2024-05-03 VITALS — BP 126/58 | HR 74 | Ht 64.0 in | Wt 155.0 lb

## 2024-05-03 DIAGNOSIS — R7303 Prediabetes: Secondary | ICD-10-CM

## 2024-05-03 DIAGNOSIS — Z0000 Encounter for general adult medical examination without abnormal findings: Secondary | ICD-10-CM

## 2024-05-03 DIAGNOSIS — I10 Essential (primary) hypertension: Secondary | ICD-10-CM | POA: Diagnosis not present

## 2024-05-03 DIAGNOSIS — Z1231 Encounter for screening mammogram for malignant neoplasm of breast: Secondary | ICD-10-CM

## 2024-05-03 DIAGNOSIS — Z1159 Encounter for screening for other viral diseases: Secondary | ICD-10-CM

## 2024-05-03 LAB — COMPREHENSIVE METABOLIC PANEL WITH GFR
ALT: 21 U/L (ref 0–35)
AST: 23 U/L (ref 0–37)
Albumin: 4.6 g/dL (ref 3.5–5.2)
Alkaline Phosphatase: 59 U/L (ref 39–117)
BUN: 13 mg/dL (ref 6–23)
CO2: 27 meq/L (ref 19–32)
Calcium: 9.8 mg/dL (ref 8.4–10.5)
Chloride: 105 meq/L (ref 96–112)
Creatinine, Ser: 0.84 mg/dL (ref 0.40–1.20)
GFR: 76.95 mL/min (ref >60.00)
Glucose, Bld: 92 mg/dL (ref 70–99)
Potassium: 4.2 meq/L (ref 3.5–5.1)
Sodium: 140 meq/L (ref 135–145)
Total Bilirubin: 0.8 mg/dL (ref 0.2–1.2)
Total Protein: 7 g/dL (ref 6.0–8.3)

## 2024-05-03 LAB — CBC WITH DIFFERENTIAL/PLATELET
Basophils Absolute: 0.1 K/uL (ref 0.0–0.1)
Basophils Relative: 2.6 % (ref 0.0–3.0)
Eosinophils Absolute: 0.2 K/uL (ref 0.0–0.7)
Eosinophils Relative: 4.2 % (ref 0.0–5.0)
HCT: 44.6 % (ref 36.0–46.0)
Hemoglobin: 15.1 g/dL — ABNORMAL HIGH (ref 12.0–15.0)
Lymphocytes Relative: 48.3 % — ABNORMAL HIGH (ref 12.0–46.0)
Lymphs Abs: 2.5 K/uL (ref 0.7–4.0)
MCHC: 33.9 g/dL (ref 30.0–36.0)
MCV: 89.7 fl (ref 78.0–100.0)
Monocytes Absolute: 0.4 K/uL (ref 0.1–1.0)
Monocytes Relative: 8 % (ref 3.0–12.0)
Neutro Abs: 1.9 K/uL (ref 1.4–7.7)
Neutrophils Relative %: 36.9 % — ABNORMAL LOW (ref 43.0–77.0)
Platelets: 264 K/uL (ref 150.0–400.0)
RBC: 4.97 Mil/uL (ref 3.87–5.11)
RDW: 13.4 % (ref 11.5–15.5)
WBC: 5.1 K/uL (ref 4.0–10.5)

## 2024-05-03 LAB — LIPID PANEL
Cholesterol: 181 mg/dL (ref 0–200)
HDL: 53.7 mg/dL (ref >39.00)
LDL Cholesterol: 107 mg/dL — ABNORMAL HIGH (ref 0–99)
NonHDL: 126.95
Total CHOL/HDL Ratio: 3
Triglycerides: 98 mg/dL (ref 0.0–149.0)
VLDL: 19.6 mg/dL (ref 0.0–40.0)

## 2024-05-03 LAB — HEPATITIS C ANTIBODY: Hepatitis C Ab: NONREACTIVE

## 2024-05-03 LAB — HEMOGLOBIN A1C: Hgb A1c MFr Bld: 5.8 % (ref 4.6–6.5)

## 2024-05-03 LAB — TSH: TSH: 0.92 u[IU]/mL (ref 0.35–5.50)

## 2024-05-03 NOTE — Progress Notes (Signed)
 Complete physical exam  Patient: Holly Daugherty   DOB: 18-May-1966   58 y.o. Female  MRN: [id number]  Subjective:    Chief Complaint  Patient presents with   Annual Exam    Holly Daugherty is a 58 y.o. female who presents today for a complete physical exam. She reports consuming a general diet. Home exercise routine includes walking. She generally feels well. She reports sleeping well. She does not have additional problems to discuss today.   Currently lives with: spouse Acute concerns or interim problems since last visit: no  Chronic Problems  Hypertension: - Medications: Lisinopril  10 mg daily. - Compliance: good - Checking BP at home: yes, normal - Denies any SOB, recurrent headaches, CP, vision changes, LE edema, dizziness, palpitations, or medication side effects. - Diet: heart healthy  - Exercise: walking  Pre-Diabetes: - Medications: none - Compliance: n/a - Denies symptoms of hypoglycemia, polyuria, polydipsia, numbness extremities, foot ulcers/trauma, wounds that are not healing, medication side effects   Lab Results  Component Value Date   HGBA1C 6.0 11/26/2023   Vision concerns: no Dental concerns: no  ETOH use: occasionally  Nicotine use: no Recreational drugs/illegal substances: no      Most recent fall risk assessment:    05/03/2024    8:48 AM  Fall Risk   Falls in the past year? 0  Number falls in past yr: 0  Injury with Fall? 0  Risk for fall due to : No Fall Risks  Follow up Falls evaluation completed     Most recent depression screenings:    05/03/2024    8:48 AM 02/01/2024   11:51 AM  PHQ 2/9 Scores  PHQ - 2 Score 0 0  PHQ- 9 Score 0 0      Data saved with a previous flowsheet row definition            Patient Care Team: Holly Daugherty NOVAK, NP as PCP - General (Family Medicine)   Outpatient Medications Prior to Visit  Medication Sig   lisinopril  (ZESTRIL ) 10 MG tablet Take 1 tablet (10 mg total) by mouth daily.   No  facility-administered medications prior to visit.    ROS All review of systems negative except what is listed in the HPI       Objective:     BP (!) 126/58   Pulse 74   Ht 5' 4 (1.626 m)   Wt 155 lb (70.3 kg)   SpO2 98%   BMI 26.61 kg/m    Physical Exam Vitals reviewed.  Constitutional:      General: She is not in acute distress.    Appearance: Normal appearance. She is not ill-appearing.  HENT:     Head: Normocephalic and atraumatic.     Right Ear: Tympanic membrane normal.     Left Ear: Tympanic membrane normal.     Nose: Nose normal.     Mouth/Throat:     Mouth: Mucous membranes are moist.     Pharynx: Oropharynx is clear.  Eyes:     Extraocular Movements: Extraocular movements intact.     Conjunctiva/sclera: Conjunctivae normal.     Pupils: Pupils are equal, round, and reactive to light.  Cardiovascular:     Rate and Rhythm: Normal rate and regular rhythm.     Pulses: Normal pulses.     Heart sounds: Normal heart sounds.  Pulmonary:     Effort: Pulmonary effort is normal.     Breath sounds: Normal breath sounds.  Abdominal:  General: Abdomen is flat. Bowel sounds are normal. There is no distension.     Palpations: Abdomen is soft. There is no mass.     Tenderness: There is no abdominal tenderness. There is no guarding or rebound.  Genitourinary:    Comments: Deferred exam Musculoskeletal:        General: Normal range of motion.     Cervical back: Normal range of motion and neck supple. No tenderness.     Right lower leg: No edema.     Left lower leg: No edema.  Lymphadenopathy:     Cervical: No cervical adenopathy.  Skin:    General: Skin is warm and dry.     Capillary Refill: Capillary refill takes less than 2 seconds.  Neurological:     General: No focal deficit present.     Mental Status: She is alert and oriented to person, place, and time. Mental status is at baseline.  Psychiatric:        Mood and Affect: Mood normal.        Behavior:  Behavior normal.        Thought Content: Thought content normal.        Judgment: Judgment normal.          No results found for any visits on 05/03/24.     Assessment & Plan:    Routine Health Maintenance and Physical Exam Discussed health promotion and safety including diet and exercise recommendations, dental health, and injury prevention. Tobacco cessation if applicable. Seat belts, sunscreen, smoke detectors, etc.    Immunization History  Administered Date(s) Administered   Influenza,inj,Quad PF,6+ Mos 04/15/2017, 02/01/2018, 01/31/2019, 05/09/2020, 02/10/2021   Moderna Sars-Covid-2 Vaccination 10/14/2019, 11/11/2019, 06/19/2020   Tdap 08/18/2023    Health Maintenance  Topic Date Due   Hepatitis C Screening  Never done   Zoster Vaccines- Shingrix (1 of 2) 05/23/2024 (Originally 07/27/2016)   Influenza Vaccine  08/29/2024 (Originally 12/31/2023)   Pneumococcal Vaccine: 50+ Years (1 of 1 - PCV) 01/31/2025 (Originally 07/27/2016)   Hepatitis B Vaccines 19-59 Average Risk (1 of 3 - 19+ 3-dose series) 01/31/2025 (Originally 07/27/1985)   COVID-19 Vaccine (4 - 2025-26 season) 05/02/2025 (Originally 01/31/2024)   Mammogram  03/11/2025   Cervical Cancer Screening (HPV/Pap Cotest)  03/11/2026   Colonoscopy  12/05/2029   DTaP/Tdap/Td (2 - Td or Tdap) 08/17/2033   HIV Screening  Completed   HPV VACCINES  Aged Out   Meningococcal B Vaccine  Aged Out        Problem List Items Addressed This Visit       Active Problems   Essential hypertension   Relevant Orders   Comprehensive metabolic panel with GFR   Prediabetes   Relevant Orders   Comprehensive metabolic panel with GFR   Hemoglobin A1c   Other Visit Diagnoses       Annual physical exam    -  Primary   Relevant Orders   CBC with Differential/Platelet   Comprehensive metabolic panel with GFR   Lipid panel   TSH     Encounter for hepatitis C screening test for low risk patient       Relevant Orders   Hepatitis C  antibody     Encounter for screening mammogram for malignant neoplasm of breast       Relevant Orders   MM 3D SCREENING MAMMOGRAM BILATERAL BREAST          PATIENT COUNSELING:    Recommend that most people either abstain from alcohol  or drink within safe limits (<=14/week and <=4 drinks/occasion for males, <=7/weeks and <= 3 drinks/occasion for females) and that the risk for alcohol disorders and other health effects rises proportionally with the number of drinks per week and how often a drinker exceeds daily limits.   Diet: Recommend to adjust caloric intake to maintain or achieve ideal body weight, to reduce intake of dietary saturated fat and total fat, to limit sodium intake by avoiding high sodium foods and not adding table salt, and to maintain adequate dietary potassium and calcium preferably from fresh fruits, vegetables, and low-fat dairy products.   Emphasized the importance of regular exercise.  Injury prevention: Recommend seatbelts, safety helmets, smoke detector, etc..   Dental health: Recommend regular tooth brushing, flossing, and dental visits.       Return in about 6 months (around 11/01/2024) for chronic disease management.     Daugherty KATHEE Mon, NP  I,Holly Daugherty,acting as a scribe for Daugherty KATHEE Mon, NP.,have documented all relevant documentation on the behalf of Daugherty KATHEE Mon, NP.  I, Daugherty KATHEE Mon, NP, have reviewed all documentation for this visit. The documentation on 05/03/2024 for the exam, diagnosis, procedures, and orders are all accurate and complete.

## 2024-05-05 ENCOUNTER — Ambulatory Visit: Payer: Self-pay | Admitting: Family Medicine

## 2024-05-21 ENCOUNTER — Ambulatory Visit (HOSPITAL_BASED_OUTPATIENT_CLINIC_OR_DEPARTMENT_OTHER)
Admission: RE | Admit: 2024-05-21 | Discharge: 2024-05-21 | Disposition: A | Discharge status: Home / Self Care | Source: Ambulatory Visit | Attending: Family Medicine | Admitting: Family Medicine

## 2024-05-21 DIAGNOSIS — Z1231 Encounter for screening mammogram for malignant neoplasm of breast: Secondary | ICD-10-CM | POA: Insufficient documentation

## 2024-05-26 ENCOUNTER — Ambulatory Visit: Admitting: Physician Assistant
